# Patient Record
Sex: Male | Born: 1975 | Race: Black or African American | Hispanic: No | Marital: Single | Smoking: Former smoker
Health system: Southern US, Community
[De-identification: ages and names within clinical notes are randomized; demographics above are authoritative.]

## PROBLEM LIST (undated history)

## (undated) DIAGNOSIS — Z21 Asymptomatic human immunodeficiency virus [HIV] infection status: Secondary | ICD-10-CM

## (undated) DIAGNOSIS — B20 Human immunodeficiency virus [HIV] disease: Secondary | ICD-10-CM

## (undated) HISTORY — PX: COLONOSCOPY: SHX174

## (undated) HISTORY — DX: Human immunodeficiency virus (HIV) disease: B20

## (undated) HISTORY — DX: Asymptomatic human immunodeficiency virus (hiv) infection status: Z21

---

## 1999-04-11 ENCOUNTER — Emergency Department (HOSPITAL_COMMUNITY): Admission: EM | Admit: 1999-04-11 | Discharge: 1999-04-11 | Payer: Self-pay | Admitting: Emergency Medicine

## 1999-07-02 ENCOUNTER — Emergency Department (HOSPITAL_COMMUNITY): Admission: EM | Admit: 1999-07-02 | Discharge: 1999-07-02 | Payer: Self-pay | Admitting: Emergency Medicine

## 2003-03-21 ENCOUNTER — Emergency Department (HOSPITAL_COMMUNITY): Admission: EM | Admit: 2003-03-21 | Discharge: 2003-03-21 | Payer: Self-pay | Admitting: *Deleted

## 2003-08-19 ENCOUNTER — Encounter: Payer: Self-pay | Admitting: Emergency Medicine

## 2003-08-19 ENCOUNTER — Emergency Department (HOSPITAL_COMMUNITY): Admission: AD | Admit: 2003-08-19 | Discharge: 2003-08-19 | Payer: Self-pay | Admitting: Emergency Medicine

## 2011-12-31 ENCOUNTER — Ambulatory Visit: Payer: Self-pay

## 2011-12-31 DIAGNOSIS — B20 Human immunodeficiency virus [HIV] disease: Secondary | ICD-10-CM

## 2011-12-31 DIAGNOSIS — B009 Herpesviral infection, unspecified: Secondary | ICD-10-CM

## 2011-12-31 LAB — CBC WITH DIFFERENTIAL/PLATELET
Eosinophils Absolute: 0.1 10*3/uL (ref 0.0–0.7)
Eosinophils Relative: 2 % (ref 0–5)
HCT: 38.6 % — ABNORMAL LOW (ref 39.0–52.0)
Hemoglobin: 13.3 g/dL (ref 13.0–17.0)
Lymphs Abs: 1.9 10*3/uL (ref 0.7–4.0)
MCH: 28.1 pg (ref 26.0–34.0)
MCHC: 34.5 g/dL (ref 30.0–36.0)
MCV: 81.4 fL (ref 78.0–100.0)
WBC: 6.1 10*3/uL (ref 4.0–10.5)

## 2011-12-31 LAB — LIPID PANEL
Cholesterol: 129 mg/dL (ref 0–200)
HDL: 29 mg/dL — ABNORMAL LOW (ref >39)
LDL Cholesterol: 63 mg/dL (ref 0–99)
Triglycerides: 187 mg/dL — ABNORMAL HIGH (ref <150)

## 2011-12-31 LAB — RPR

## 2012-01-01 LAB — COMPLETE METABOLIC PANEL WITH GFR
ALT: 21 U/L (ref 0–53)
AST: 34 U/L (ref 0–37)
CO2: 25 mEq/L (ref 19–32)
Chloride: 104 mEq/L (ref 96–112)
Glucose, Bld: 57 mg/dL — ABNORMAL LOW (ref 70–99)
Potassium: 4.2 mEq/L (ref 3.5–5.3)
Sodium: 138 mEq/L (ref 135–145)
Total Protein: 7.2 g/dL (ref 6.0–8.3)

## 2012-01-01 LAB — GC/CHLAMYDIA PROBE AMP, URINE: Chlamydia, Swab/Urine, PCR: NEGATIVE

## 2012-01-01 LAB — URINALYSIS
Glucose, UA: NEGATIVE mg/dL
Leukocytes, UA: NEGATIVE
Nitrite: NEGATIVE
Protein, ur: NEGATIVE mg/dL
Urobilinogen, UA: 0.2 mg/dL (ref 0.0–1.0)

## 2012-01-01 LAB — HEPATITIS A ANTIBODY, TOTAL: Hep A Total Ab: NEGATIVE

## 2012-01-01 LAB — T-HELPER CELL (CD4) - (RCID CLINIC ONLY): CD4 % Helper T Cell: 24 % — ABNORMAL LOW (ref 33–55)

## 2012-01-01 LAB — HEPATITIS B CORE ANTIBODY, TOTAL: Hep B Core Total Ab: NEGATIVE

## 2012-01-01 LAB — HEPATITIS C ANTIBODY: HCV Ab: NEGATIVE

## 2012-01-01 LAB — HEPATITIS B SURFACE ANTIGEN: Hepatitis B Surface Ag: NEGATIVE

## 2012-01-04 LAB — HIV-1 RNA ULTRAQUANT REFLEX TO GENTYP+: HIV-1 RNA Quant, Log: <1.3 {Log} (ref <1.30)

## 2012-01-12 DIAGNOSIS — B009 Herpesviral infection, unspecified: Secondary | ICD-10-CM | POA: Insufficient documentation

## 2012-01-12 NOTE — Progress Notes (Signed)
Pt was incarcerated for 12 years.  Released November 2012.  Pt states he is adherent with ART.

## 2012-01-14 ENCOUNTER — Ambulatory Visit (INDEPENDENT_AMBULATORY_CARE_PROVIDER_SITE_OTHER): Payer: Self-pay | Admitting: Internal Medicine

## 2012-01-14 ENCOUNTER — Encounter: Payer: Self-pay | Admitting: Internal Medicine

## 2012-01-14 VITALS — BP 152/74 | HR 67 | Temp 97.5°F | Ht 72.5 in | Wt 212.8 lb

## 2012-01-14 DIAGNOSIS — B20 Human immunodeficiency virus [HIV] disease: Secondary | ICD-10-CM | POA: Insufficient documentation

## 2012-01-14 DIAGNOSIS — Z21 Asymptomatic human immunodeficiency virus [HIV] infection status: Secondary | ICD-10-CM

## 2012-01-14 DIAGNOSIS — B009 Herpesviral infection, unspecified: Secondary | ICD-10-CM

## 2012-01-14 DIAGNOSIS — Z23 Encounter for immunization: Secondary | ICD-10-CM

## 2012-01-14 MED ORDER — EMTRICITABINE-TENOFOVIR DF 200-300 MG PO TABS: 1.0000 | ORAL_TABLET | Freq: Every day | ORAL | Status: DC

## 2012-01-14 MED ORDER — ATAZANAVIR SULFATE 300 MG PO CAPS: 300.0000 mg | ORAL_CAPSULE | Freq: Every day | ORAL | Status: DC

## 2012-01-14 MED ORDER — RITONAVIR 100 MG PO CAPS: 100.0000 mg | ORAL_CAPSULE | Freq: Every day | ORAL | Status: DC

## 2012-01-14 NOTE — Patient Instructions (Signed)
Start your medicines once you receive it and come in for labs about 3 weeks later.

## 2012-01-14 NOTE — Assessment & Plan Note (Signed)
He has been well controlled on his therapy, unfortunately, there will be a delay in getting him back on therrapy through [facility].  He will restart the same and have follow up labs about 2-3 weeks later and follow up appt 2 weeks after that.  I reminded him to use condoms with all sexual activity.

## 2012-01-14 NOTE — Progress Notes (Signed)
  Subjective:    Patient ID: Jose Carter, male    DOB: 29-Jan-1976, 36 y.o.   MRN: [id number]  HPI He comes in as a new patient.  He was recently released from jail and has been HIV positive since 2001.  He has been on Atripla in the past briefly and Kaletra and Truvada until 2007, when he took a drug holiday.  He then started on boosted Reyataz and Truvada which he has been on since.  He reports excellent adherence and denies any missed doses.  He is currently living in a halfway house and exercises regularly and eats healthy.  He has had no recent weight loss, diarrhea, rashes or dysphagia.    Review of Systems  Constitutional: Negative for fever, fatigue and unexpected weight change.  HENT: Negative for sore throat and trouble swallowing.   Respiratory: Negative for cough and shortness of breath.   Cardiovascular: Negative for chest pain and leg swelling.  Gastrointestinal: Negative for nausea, vomiting, abdominal pain, diarrhea and constipation.  Genitourinary: Negative for discharge and genital sores.  Musculoskeletal: Negative for myalgias and arthralgias.  Skin: Negative for pallor and rash.  Neurological: Negative for light-headedness and headaches.  Hematological: Negative for adenopathy.  Psychiatric/Behavioral: Negative for dysphoric mood. The patient is not nervous/anxious.        Objective:   Physical Exam  Constitutional: He is oriented to person, place, and time. He appears well-developed and well-nourished. No distress.  HENT:  Mouth/Throat: Oropharynx is clear and moist. No oropharyngeal exudate.  Cardiovascular: Normal rate, regular rhythm and normal heart sounds.  Exam reveals no gallop and no friction rub.   No murmur heard. Pulmonary/Chest: Effort normal and breath sounds normal. No respiratory distress. He has no wheezes. He has no rales.  Abdominal: Soft. Bowel sounds are normal. He exhibits no distension. There is no tenderness. There is no rebound.    Genitourinary: Penis normal. No penile tenderness.  Musculoskeletal: Normal range of motion. He exhibits no edema.  Lymphadenopathy:    He has no cervical adenopathy.  Neurological: He is alert and oriented to person, place, and time.  Skin: Skin is warm and dry. No rash noted. No erythema.  Psychiatric: He has a normal mood and affect. His behavior is normal.          Assessment & Plan:

## 2012-01-22 ENCOUNTER — Encounter: Payer: Self-pay | Admitting: Internal Medicine

## 2012-01-27 ENCOUNTER — Other Ambulatory Visit: Payer: Self-pay | Admitting: *Deleted

## 2012-01-27 DIAGNOSIS — B20 Human immunodeficiency virus [HIV] disease: Secondary | ICD-10-CM

## 2012-01-27 MED ORDER — RITONAVIR 100 MG PO CAPS: 100.0000 mg | ORAL_CAPSULE | Freq: Every day | ORAL | Status: DC

## 2012-01-27 MED ORDER — EMTRICITABINE-TENOFOVIR DF 200-300 MG PO TABS: 1.0000 | ORAL_TABLET | Freq: Every day | ORAL | Status: DC

## 2012-01-27 MED ORDER — ATAZANAVIR SULFATE 300 MG PO CAPS: 300.0000 mg | ORAL_CAPSULE | Freq: Every day | ORAL | Status: DC

## 2012-02-18 ENCOUNTER — Other Ambulatory Visit: Payer: Self-pay

## 2012-02-25 ENCOUNTER — Ambulatory Visit (INDEPENDENT_AMBULATORY_CARE_PROVIDER_SITE_OTHER): Payer: Self-pay | Admitting: Licensed Clinical Social Worker

## 2012-02-25 ENCOUNTER — Other Ambulatory Visit (INDEPENDENT_AMBULATORY_CARE_PROVIDER_SITE_OTHER): Payer: Self-pay

## 2012-02-25 DIAGNOSIS — B20 Human immunodeficiency virus [HIV] disease: Secondary | ICD-10-CM

## 2012-02-25 DIAGNOSIS — Z23 Encounter for immunization: Secondary | ICD-10-CM

## 2012-02-25 LAB — COMPREHENSIVE METABOLIC PANEL
Alkaline Phosphatase: 61 U/L (ref 39–117)
BUN: 20 mg/dL (ref 6–23)
CO2: 25 mEq/L (ref 19–32)
Creat: 1.39 mg/dL — ABNORMAL HIGH (ref 0.50–1.35)
Glucose, Bld: 76 mg/dL (ref 70–99)
Total Bilirubin: 2.5 mg/dL — ABNORMAL HIGH (ref 0.3–1.2)

## 2012-02-25 LAB — CBC WITH DIFFERENTIAL/PLATELET
Basophils Relative: 0 % (ref 0–1)
Eosinophils Absolute: 0.2 10*3/uL (ref 0.0–0.7)
Eosinophils Relative: 3 % (ref 0–5)
Hemoglobin: 13.6 g/dL (ref 13.0–17.0)
Lymphs Abs: 1.9 10*3/uL (ref 0.7–4.0)
MCH: 27.4 pg (ref 26.0–34.0)
MCHC: 34.4 g/dL (ref 30.0–36.0)
MCV: 79.6 fL (ref 78.0–100.0)
Monocytes Relative: 8 % (ref 3–12)
RBC: 4.96 MIL/uL (ref 4.22–5.81)

## 2012-02-26 LAB — T-HELPER CELL (CD4) - (RCID CLINIC ONLY)
CD4 % Helper T Cell: 18 % — ABNORMAL LOW (ref 33–55)
CD4 T Cell Abs: 370 uL — ABNORMAL LOW (ref 400–2700)

## 2012-02-29 LAB — HIV-1 RNA ULTRAQUANT REFLEX TO GENTYP+: HIV 1 RNA Quant: 384 copies/mL — ABNORMAL HIGH (ref <20)

## 2012-03-03 ENCOUNTER — Ambulatory Visit: Payer: Self-pay | Admitting: Internal Medicine

## 2012-03-10 ENCOUNTER — Encounter: Payer: Self-pay | Admitting: Internal Medicine

## 2012-03-10 ENCOUNTER — Ambulatory Visit (INDEPENDENT_AMBULATORY_CARE_PROVIDER_SITE_OTHER): Payer: Self-pay | Admitting: Internal Medicine

## 2012-03-10 VITALS — BP 119/74 | HR 56 | Temp 97.3°F | Ht 73.0 in | Wt 209.0 lb

## 2012-03-10 DIAGNOSIS — B20 Human immunodeficiency virus [HIV] disease: Secondary | ICD-10-CM

## 2012-03-13 ENCOUNTER — Encounter: Payer: Self-pay | Admitting: Internal Medicine

## 2012-03-13 NOTE — Assessment & Plan Note (Signed)
He is back on meds and his viral load is just in the hundreds.  I suspect it is coming back down well again.  I will have him return in 2 months for labs to assure it is responding.  He was reminded to use condoms for all sexual activity.

## 2012-03-13 NOTE — Progress Notes (Signed)
  Subjective:    Patient ID: Jose Carter, male    DOB: [birth year], 36 y.o.   MRN: [id number]  HPI  Second visit for this patient.  He was recently released from jail and has been HIV positive since 2001. He has been on Atripla in the past briefly and Kaletra and Truvada until 2007, when he took a drug holiday. He then started on boosted Reyataz and Truvada about 1-2 years ago, which he has been on since. He reports excellent adherence and denies any missed doses. He is currently living in a halfway house and exercises regularly and eats healthy. Unfortunately though, he ran out of his meds and restarted not long prior to his lab visit.  He was out about 3 weeks while he got established with [facility].  Now on it again, he denies any missed doses.       Review of Systems  Constitutional: Negative for fever, chills, appetite change and unexpected weight change.  HENT: Negative for sore throat and trouble swallowing.   Respiratory: Negative for cough and shortness of breath.   Cardiovascular: Negative for chest pain, palpitations and leg swelling.  Gastrointestinal: Negative for abdominal pain, diarrhea and constipation.  Genitourinary: Negative for discharge and genital sores.  Musculoskeletal: Negative for myalgias and arthralgias.  Skin: Negative for pallor and rash.  Neurological: Negative for dizziness and headaches.  Hematological: Negative for adenopathy.  Psychiatric/Behavioral: Negative for dysphoric mood. The patient is not nervous/anxious.        Objective:   Physical Exam  Constitutional: He appears well-developed and well-nourished. No distress.  HENT:  Mouth/Throat: Oropharynx is clear and moist. No oropharyngeal exudate.  Eyes: No scleral icterus.  Cardiovascular: Normal rate, regular rhythm and normal heart sounds.  Exam reveals no gallop and no friction rub.   No murmur heard. Pulmonary/Chest: Effort normal and breath sounds normal. No respiratory distress. He has no  wheezes. He has no rales.  Abdominal: Soft. Bowel sounds are normal. He exhibits no distension. There is no tenderness. There is no rebound.  Lymphadenopathy:    He has no cervical adenopathy.  Skin: Skin is warm and dry. No rash noted.          Assessment & Plan:

## 2012-05-01 ENCOUNTER — Emergency Department (HOSPITAL_COMMUNITY)
Admission: EM | Admit: 2012-05-01 | Discharge: 2012-05-01 | Disposition: A | Discharge status: Home / Self Care | Payer: Medicaid Other | Attending: Emergency Medicine | Admitting: Emergency Medicine

## 2012-05-01 ENCOUNTER — Encounter (HOSPITAL_COMMUNITY): Payer: Self-pay | Admitting: *Deleted

## 2012-05-01 DIAGNOSIS — R197 Diarrhea, unspecified: Secondary | ICD-10-CM | POA: Insufficient documentation

## 2012-05-01 DIAGNOSIS — K5289 Other specified noninfective gastroenteritis and colitis: Secondary | ICD-10-CM | POA: Insufficient documentation

## 2012-05-01 DIAGNOSIS — R112 Nausea with vomiting, unspecified: Secondary | ICD-10-CM | POA: Insufficient documentation

## 2012-05-01 DIAGNOSIS — R1310 Dysphagia, unspecified: Secondary | ICD-10-CM | POA: Insufficient documentation

## 2012-05-01 DIAGNOSIS — Z21 Asymptomatic human immunodeficiency virus [HIV] infection status: Secondary | ICD-10-CM | POA: Insufficient documentation

## 2012-05-01 DIAGNOSIS — M549 Dorsalgia, unspecified: Secondary | ICD-10-CM | POA: Insufficient documentation

## 2012-05-01 DIAGNOSIS — K529 Noninfective gastroenteritis and colitis, unspecified: Secondary | ICD-10-CM

## 2012-05-01 DIAGNOSIS — R1013 Epigastric pain: Secondary | ICD-10-CM | POA: Insufficient documentation

## 2012-05-01 DIAGNOSIS — Z79899 Other long term (current) drug therapy: Secondary | ICD-10-CM | POA: Insufficient documentation

## 2012-05-01 LAB — COMPREHENSIVE METABOLIC PANEL
ALT: 22 U/L (ref 0–53)
Alkaline Phosphatase: 63 U/L (ref 39–117)
CO2: 29 mEq/L (ref 19–32)
GFR calc Af Amer: 69 mL/min — ABNORMAL LOW (ref >90)
GFR calc non Af Amer: 59 mL/min — ABNORMAL LOW (ref >90)
Glucose, Bld: 116 mg/dL — ABNORMAL HIGH (ref 70–99)
Potassium: 4.2 mEq/L (ref 3.5–5.1)
Sodium: 139 mEq/L (ref 135–145)
Total Bilirubin: 0.6 mg/dL (ref 0.3–1.2)

## 2012-05-01 LAB — URINALYSIS, ROUTINE W REFLEX MICROSCOPIC
Glucose, UA: NEGATIVE mg/dL
Ketones, ur: NEGATIVE mg/dL
Leukocytes, UA: NEGATIVE
Protein, ur: NEGATIVE mg/dL

## 2012-05-01 LAB — DIFFERENTIAL
Basophils Relative: 0 % (ref 0–1)
Eosinophils Absolute: 0.1 10*3/uL (ref 0.0–0.7)
Monocytes Relative: 6 % (ref 3–12)
Neutrophils Relative %: 87 % — ABNORMAL HIGH (ref 43–77)

## 2012-05-01 LAB — CBC
MCH: 28.3 pg (ref 26.0–34.0)
MCHC: 35.6 g/dL (ref 30.0–36.0)
Platelets: 201 10*3/uL (ref 150–400)

## 2012-05-01 LAB — ETHANOL: Alcohol, Ethyl (B): <11 mg/dL (ref 0–11)

## 2012-05-01 MED ORDER — ONDANSETRON HCL 4 MG PO TABS: 4.0000 mg | ORAL_TABLET | Freq: Three times a day (TID) | ORAL | Status: DC | PRN

## 2012-05-01 MED ORDER — ONDANSETRON HCL 4 MG/2ML IJ SOLN
4.0000 mg | Freq: Once | INTRAMUSCULAR | Status: AC
  Administered 2012-05-01: 4 mg via INTRAVENOUS
  Filled 2012-05-01: qty 2

## 2012-05-01 MED ORDER — HYDROCODONE-ACETAMINOPHEN 5-325 MG PO TABS: 1.0000 | ORAL_TABLET | Freq: Four times a day (QID) | ORAL | Status: AC | PRN

## 2012-05-01 MED ORDER — SODIUM CHLORIDE 0.9 % IV BOLUS (SEPSIS)
1000.0000 mL | Freq: Once | INTRAVENOUS | Status: AC
  Administered 2012-05-01: 1000 mL via INTRAVENOUS

## 2012-05-01 MED ORDER — ONDANSETRON HCL 4 MG PO TABS: 4.0000 mg | ORAL_TABLET | Freq: Three times a day (TID) | ORAL | Status: AC | PRN

## 2012-05-01 MED ORDER — SODIUM CHLORIDE 0.9 % IV BOLUS (SEPSIS): 500.0000 mL | Freq: Once | INTRAVENOUS | Status: DC

## 2012-05-01 MED ORDER — SODIUM CHLORIDE 0.9 % IV BOLUS (SEPSIS)
500.0000 mL | Freq: Once | INTRAVENOUS | Status: AC
  Administered 2012-05-01: 08:00:00 via INTRAVENOUS

## 2012-05-01 MED ORDER — HYDROMORPHONE HCL PF 1 MG/ML IJ SOLN
1.0000 mg | INTRAMUSCULAR | Status: DC | PRN
  Administered 2012-05-01 (×2): 1 mg via INTRAVENOUS
  Filled 2012-05-01 (×3): qty 1

## 2012-05-01 NOTE — Discharge Instructions (Signed)
Viral Gastroenteritis Viral gastroenteritis is also known as stomach flu. This condition affects the stomach and intestinal tract. It can cause sudden diarrhea and vomiting. The illness typically lasts 3 to 8 days. Most people develop an immune response that eventually gets rid of the virus. While this natural response develops, the virus can make you quite ill. CAUSES  Many different viruses can cause gastroenteritis, such as rotavirus or noroviruses. You can catch one of these viruses by consuming contaminated food or water. You may also catch a virus by sharing utensils or other personal items with an infected person or by touching a contaminated surface. SYMPTOMS  The most common symptoms are diarrhea and vomiting. These problems can cause a severe loss of body fluids (dehydration) and a body salt (electrolyte) imbalance. Other symptoms may include:  Fever.   Headache.   Fatigue.   Abdominal pain.  DIAGNOSIS  Your caregiver can usually diagnose viral gastroenteritis based on your symptoms and a physical exam. A stool sample may also be taken to test for the presence of viruses or other infections. TREATMENT  This illness typically goes away on its own. Treatments are aimed at rehydration. The most serious cases of viral gastroenteritis involve vomiting so severely that you are not able to keep fluids down. In these cases, fluids must be given through an intravenous line (IV). HOME CARE INSTRUCTIONS   Drink enough fluids to keep your urine clear or pale yellow. Drink small amounts of fluids frequently and increase the amounts as tolerated.   Ask your caregiver for specific rehydration instructions.   Avoid:   Foods high in sugar.   Alcohol.   Carbonated drinks.   Tobacco.   Juice.   Caffeine drinks.   Extremely hot or cold fluids.   Fatty, greasy foods.   Too much intake of anything at one time.   Dairy products until 24 to 48 hours after diarrhea stops.   You may  consume probiotics. Probiotics are active cultures of beneficial bacteria. They may lessen the amount and number of diarrheal stools in adults. Probiotics can be found in yogurt with active cultures and in supplements.   Wash your hands well to avoid spreading the virus.   Only take over-the-counter or prescription medicines for pain, discomfort, or fever as directed by your caregiver. Do not give aspirin to children. Antidiarrheal medicines are not recommended.   Ask your caregiver if you should continue to take your regular prescribed and over-the-counter medicines.   Keep all follow-up appointments as directed by your caregiver.  SEEK IMMEDIATE MEDICAL CARE IF:   You are unable to keep fluids down.   You do not urinate at least once every 6 to 8 hours.   You develop shortness of breath.   You notice blood in your stool or vomit. This may look like coffee grounds.   You have abdominal pain that increases or is concentrated in one small area (localized).   You have persistent vomiting or diarrhea.   You have a fever.   The patient is a child younger than 3 months, and he or she has a fever.   The patient is a child older than 3 months, and he or she has a fever and persistent symptoms.   The patient is a child older than 3 months, and he or she has a fever and symptoms suddenly get worse.   The patient is a baby, and he or she has no tears when crying.  MAKE SURE YOU:     Understand these instructions.   Will watch your condition.   Will get help right away if you are not doing well or get worse.  Document Released: 12/07/2005 Document Revised: 11/26/2011 Document Reviewed: 09/23/2011 [employer] Patient Information 2012 [employer]. 

## 2012-05-01 NOTE — ED Notes (Signed)
Urine had hair in it.  Informed pt of need for another specimen.  MD informed

## 2012-05-01 NOTE — ED Provider Notes (Signed)
History     CSN: [id number]  Arrival date & time 05/01/12  9147   First MD Initiated Contact with Patient 05/01/12 0750      Chief Complaint  Patient presents with  . Abdominal Pain    LUQ  . Nausea  . Emesis  . Diarrhea    (Consider location/radiation/quality/duration/timing/severity/associated sxs/prior treatment) HPI 36 yo male with history of HIV who presents with complaint of sudden onset abdominal pain this morning.  States that this morning he was awakened from his sleep with severe abdominal pain.  Pain is located in periumbilical and epigastric region.  He has never had pain like this before.  States pain does radiate to his back.  He has had 3 episodes of vomiting and some loose stools this morning.  Does admit to EtOH use last night, but states "it wasn't much, only a few cups."  He denies blood in his stool or emesis.  Says he has been compliant with current HIV therapy Past Medical History  Diagnosis Date  . HIV infection     History reviewed. No pertinent past surgical history.  History reviewed. No pertinent family history.  History  Substance Use Topics  . Smoking status: Current Some Day Smoker    Types: Cigarettes    Last Attempt to Quit: 02/11/2012  . Smokeless tobacco: Never Used  . Alcohol Use: Yes     socially      Review of Systems  Constitutional: Negative for fever, chills, activity change and appetite change.  HENT: Positive for trouble swallowing. Negative for sore throat.   Respiratory: Negative for cough, chest tightness and shortness of breath.   Cardiovascular: Negative for chest pain.  Gastrointestinal: Positive for nausea, vomiting, abdominal pain and diarrhea. Negative for constipation, blood in stool and abdominal distention.  Genitourinary: Negative for dysuria, urgency and frequency.  Neurological: Negative for dizziness and headaches.    Allergies  Sustiva  Home Medications   Current Outpatient Rx  Name Route Sig Dispense  Refill  . ATAZANAVIR SULFATE 300 MG PO CAPS Oral Take 1 capsule (300 mg total) by mouth daily with breakfast. 30 capsule 11  . EMTRICITABINE-TENOFOVIR 200-300 MG PO TABS Oral Take 1 tablet by mouth daily. 30 tablet 11  . RITONAVIR 100 MG PO CAPS Oral Take 1 capsule (100 mg total) by mouth daily. 30 capsule 11    BP 122/62  Pulse 75  Temp(Src) 97.2 F (36.2 C) (Oral)  Resp 20  SpO2 100%  Physical Exam  Constitutional: He is oriented to person, place, and time. He appears well-nourished.       Writhing in bed, appears to be quit uncomfortable.   HENT:  Head: Normocephalic and atraumatic.       Oropharynx pink and dry   Eyes: Conjunctivae are normal. No scleral icterus.  Neck: Normal range of motion.  Cardiovascular: Normal rate, regular rhythm and normal heart sounds.   Pulmonary/Chest: Effort normal and breath sounds normal.  Abdominal:       Abdomen is soft, however he has significant pain to palpation in the epigastric region with guarding.  No tenderness in lower abdominal regions.  BS hypoactive.   Musculoskeletal: He exhibits no edema.  Lymphadenopathy:    He has no cervical adenopathy.  Neurological: He is alert and oriented to person, place, and time.  Skin: Skin is warm and dry.    ED Course  Procedures (including critical care time)  Labs Reviewed  COMPREHENSIVE METABOLIC PANEL - Abnormal; Notable for the  following:    Glucose, Bld 116 (*)    Creatinine, Ser 1.50 (*)    GFR calc non Af Amer 59 (*)    GFR calc Af Amer 69 (*)    All other components within normal limits  CBC - Abnormal; Notable for the following:    WBC 10.6 (*)    All other components within normal limits  DIFFERENTIAL - Abnormal; Notable for the following:    Neutrophils Relative 87 (*)    Neutro Abs 9.3 (*)    Lymphocytes Relative 7 (*)    All other components within normal limits  LIPASE, BLOOD  ETHANOL  URINALYSIS, ROUTINE W REFLEX MICROSCOPIC   No results found.   No diagnosis  found.    MDM  Seems to be in a good deal of pain.  Concern for possible pancreatitis given amount of pain, n/v and location of pain in setting of recent EtOH use.  Check labs, dilaudid for pain control, zofran for nausea. 8:21 AM  On reassessment patient states pain has resolved with 1mg  dilaudid.  WBC elevated, with left shift.  Could be from demargination from vomiting.  Will await UA.  Lipase normal, looking to be likely gastroenteritis. 9:43 AM  Patient feeling much better and comfortable with going home, likely gastroenteritis, will give zofran for nausea control and norco for pain control.  Advised to f/u with ID clinic or return to ED for worsening symptoms.          Everrett Coombe, DO 05/01/12 1137

## 2012-05-01 NOTE — ED Notes (Signed)
Pt from home with reports of sudden onset of abdominal pain at around 0500 this morning as well as N/V/D.

## 2012-05-09 ENCOUNTER — Other Ambulatory Visit (INDEPENDENT_AMBULATORY_CARE_PROVIDER_SITE_OTHER): Payer: Self-pay

## 2012-05-09 DIAGNOSIS — B20 Human immunodeficiency virus [HIV] disease: Secondary | ICD-10-CM

## 2012-05-09 LAB — CBC WITH DIFFERENTIAL/PLATELET
Eosinophils Absolute: 0.2 10*3/uL (ref 0.0–0.7)
Eosinophils Relative: 4 % (ref 0–5)
HCT: 36.2 % — ABNORMAL LOW (ref 39.0–52.0)
Hemoglobin: 12.7 g/dL — ABNORMAL LOW (ref 13.0–17.0)
Lymphs Abs: 2.1 10*3/uL (ref 0.7–4.0)
MCH: 27.9 pg (ref 26.0–34.0)
MCV: 79.6 fL (ref 78.0–100.0)
Monocytes Absolute: 0.4 10*3/uL (ref 0.1–1.0)
Monocytes Relative: 7 % (ref 3–12)
Platelets: 232 10*3/uL (ref 150–400)
RBC: 4.55 MIL/uL (ref 4.22–5.81)

## 2012-05-09 LAB — COMPLETE METABOLIC PANEL WITHOUT GFR
ALT: 25 U/L (ref 0–53)
AST: 39 U/L — ABNORMAL HIGH (ref 0–37)
Albumin: 4.2 g/dL (ref 3.5–5.2)
Alkaline Phosphatase: 56 U/L (ref 39–117)
BUN: 21 mg/dL (ref 6–23)
CO2: 26 meq/L (ref 19–32)
Calcium: 9.1 mg/dL (ref 8.4–10.5)
Chloride: 106 meq/L (ref 96–112)
Creat: 1.42 mg/dL — ABNORMAL HIGH (ref 0.50–1.35)
GFR, Est African American: 74 mL/min
GFR, Est Non African American: 64 mL/min
Glucose, Bld: 80 mg/dL (ref 70–99)
Potassium: 4.2 meq/L (ref 3.5–5.3)
Sodium: 140 meq/L (ref 135–145)
Total Bilirubin: 2.8 mg/dL — ABNORMAL HIGH (ref 0.3–1.2)
Total Protein: 6.5 g/dL (ref 6.0–8.3)

## 2012-05-10 NOTE — ED Provider Notes (Signed)
I saw and evaluated the patient, reviewed the resident's note and I agree with the findings and plan.   .Face to face Exam:  General:  Awake HEENT:  Atraumatic Resp:  Normal effort Abd:  Nondistended Neuro:No focal weakness Lymph: No adenopathy   Nelia Shi, MD 05/10/12 [phone]

## 2012-05-11 LAB — T-HELPER CELL (CD4) - (RCID CLINIC ONLY): CD4 % Helper T Cell: 19 % — ABNORMAL LOW (ref 33–55)

## 2012-05-24 ENCOUNTER — Encounter: Payer: Self-pay | Admitting: Internal Medicine

## 2012-05-24 ENCOUNTER — Ambulatory Visit (INDEPENDENT_AMBULATORY_CARE_PROVIDER_SITE_OTHER): Payer: Self-pay | Admitting: Internal Medicine

## 2012-05-24 VITALS — BP 143/82 | HR 61 | Temp 97.6°F | Ht 73.0 in | Wt 209.0 lb

## 2012-05-24 DIAGNOSIS — B20 Human immunodeficiency virus [HIV] disease: Secondary | ICD-10-CM

## 2012-05-24 NOTE — Progress Notes (Signed)
  Subjective:    Patient ID: Jose Carter, male    DOB: Dec 31, 1975, 36 y.o.   MRN: [id number]  HPI He comes in for follow up of his HIV. He had previously been on Kaletra and Truvada as well as Atripla was started posted Reyataz and Truvada in jail and has been on it since. He reports good compliance since then and did become undetectable though has had some detectable virus since. His most recent labs do show minimally detectable virus at 37 copies and a T-cell count of 370 which is stable from his last visit. He does also have a minimally elevated creatinine at 1.4 to those creatinine clearance is well over 50. He tells me that he is taking a lot of different supplements that he gets from [facility] including testosterone as well as stacking supplements. He takes this in order to increase his bulk. He also complains of intermittent chest pain that is palpable.   Review of Systems  Constitutional: Negative for fever, chills, fatigue and unexpected weight change.  HENT: Negative for sore throat and trouble swallowing.   Respiratory: Negative for cough, shortness of breath and wheezing.   Cardiovascular: Negative for chest pain, palpitations and leg swelling.       Chest wall pain  Gastrointestinal: Negative for nausea, abdominal pain and diarrhea.  Musculoskeletal: Negative for myalgias, joint swelling and arthralgias.  Skin: Negative for pallor and rash.  Neurological: Negative for dizziness and headaches.  Hematological: Negative for adenopathy.  Psychiatric/Behavioral: Negative for dysphoric mood. The patient is not nervous/anxious.        Objective:   Physical Exam  Constitutional: He appears well-developed and well-nourished. No distress.  HENT:  Mouth/Throat: Oropharynx is clear and moist. No oropharyngeal exudate.  Cardiovascular: Normal rate, regular rhythm and normal heart sounds.  Exam reveals no gallop and no friction rub.   No murmur heard. Pulmonary/Chest: Effort normal and  breath sounds normal. No respiratory distress. He has no wheezes. He has no rales.  Abdominal: Soft. Bowel sounds are normal. He exhibits no distension. There is no tenderness. There is no rebound.  Lymphadenopathy:    He has no cervical adenopathy.  Skin: Skin is warm and dry. No rash noted. No erythema.          Assessment & Plan:

## 2012-05-24 NOTE — Assessment & Plan Note (Signed)
He is doing well with his regimen and does take it every day. He does still have some detectable virus his CD4 has remained same as before. I did discuss with him at length that some of these supplements can interact with his medications it is unknown what they're effective since they were not studied. I also worry about his creatinine increasing from the supplements and then somebody taking tenofovir this can exacerbate the effect. I have asked him and he has agreed to stop taking the supplements for the next few months to see if he does have some improvement in his creatinine. I also did remind him to use condoms with all sexual activity

## 2012-08-11 ENCOUNTER — Other Ambulatory Visit (INDEPENDENT_AMBULATORY_CARE_PROVIDER_SITE_OTHER): Payer: Self-pay

## 2012-08-11 ENCOUNTER — Ambulatory Visit (INDEPENDENT_AMBULATORY_CARE_PROVIDER_SITE_OTHER): Payer: Self-pay | Admitting: Internal Medicine

## 2012-08-11 ENCOUNTER — Encounter: Payer: Self-pay | Admitting: Internal Medicine

## 2012-08-11 ENCOUNTER — Encounter: Payer: Self-pay | Admitting: *Deleted

## 2012-08-11 VITALS — BP 120/76 | HR 45 | Temp 97.5°F | Wt 217.0 lb

## 2012-08-11 DIAGNOSIS — B20 Human immunodeficiency virus [HIV] disease: Secondary | ICD-10-CM

## 2012-08-11 DIAGNOSIS — G510 Bell's palsy: Secondary | ICD-10-CM | POA: Insufficient documentation

## 2012-08-11 LAB — COMPLETE METABOLIC PANEL WITH GFR
Alkaline Phosphatase: 68 U/L (ref 39–117)
Creat: 1.35 mg/dL (ref 0.50–1.35)
GFR, Est African American: 79 mL/min
GFR, Est Non African American: 68 mL/min
Glucose, Bld: 82 mg/dL (ref 70–99)
Sodium: 139 mEq/L (ref 135–145)
Total Bilirubin: 2.5 mg/dL — ABNORMAL HIGH (ref 0.3–1.2)
Total Protein: 6.9 g/dL (ref 6.0–8.3)

## 2012-08-11 LAB — CBC WITH DIFFERENTIAL/PLATELET
Basophils Relative: 0 % (ref 0–1)
Eosinophils Absolute: 0.2 10*3/uL (ref 0.0–0.7)
Eosinophils Relative: 3 % (ref 0–5)
MCH: 28 pg (ref 26.0–34.0)
MCHC: 33.2 g/dL (ref 30.0–36.0)
MCV: 84.6 fL (ref 78.0–100.0)
Monocytes Relative: 9 % (ref 3–12)
Neutrophils Relative %: 54 % (ref 43–77)
Platelets: 306 10*3/uL (ref 150–400)

## 2012-08-11 MED ORDER — PREDNISONE 20 MG PO TABS: ORAL_TABLET | ORAL | Status: DC

## 2012-08-11 NOTE — Progress Notes (Signed)
Patient ID: Jose Carter, male   DOB: 24-Oct-1976, 36 y.o.   MRN: [id number] RN spoke with pt who reports left-sided facial/oral numbness and decreased muscle movement on left side of face.  RN spoke with Dr. Drue Second who said she would see the pt.  Appt made for this AM.

## 2012-08-11 NOTE — Progress Notes (Signed)
HIV CLINIC VISIT  RFV: sick appt/ new onset facial numbness/ asymmetric smile Subjective:    Patient ID: Jose Carter, male    DOB: 10-29-1976, 36 y.o.   MRN: [id number]  HPI Kiril is a 35yo AAM with HIV, CD 4 count of X, VL Y, on truvada-boosted atazanavir. Has spotty adherence. He states that he has missed multiple doses in the last few weeks. Two wks ago, left for vacation to [place], missed his meds for 3 days straight, then a week later, missed 2 days.   He stated that this morning, he woke up with Left side neck pain which he attributes to working out. He would bench press bar bells but was looking to the right. He did notice this morning that he was having difficulty pursing his lips together. Other unusual symptoms include yesterday afternoon felt tongue being numb but thought to be due to pineapples. He notices the Left sided  Of face being numb - mostly on cheeks. No new rash, no vesicles, no ringing in his ears, no decrease hearing.  No fevers, chills. No diarrhea  Current Outpatient Prescriptions on File Prior to Visit  Medication Sig Dispense Refill  . atazanavir (REYATAZ) 300 MG capsule Take 1 capsule (300 mg total) by mouth daily with breakfast.  30 capsule  11  . emtricitabine-tenofovir (TRUVADA) 200-300 MG per tablet Take 1 tablet by mouth daily.  30 tablet  11  . ritonavir (NORVIR) 100 MG capsule Take 1 capsule (100 mg total) by mouth daily.  30 capsule  11   Active Ambulatory Problems    Diagnosis Date Noted  . 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 HIV disease 01/14/2012   Resolved Ambulatory Problems    Diagnosis Date Noted  . No Resolved Ambulatory Problems   Past Medical History  Diagnosis Date  . HIV infection     s Review of Systems 10 point ROS reviewed, positive pertinents as listed above    Objective:   Physical Exam BP 120/76  Pulse 45  Temp 97.5 F (36.4 C) (Oral)  Wt 217 lb (98.431 kg) Gen: young male in NAD, no slurred speech.  neuro: sensation decreased  only to V2 distribution/only cheek. Motor: Noticeable unable to close left eyelid tightly. Asymmetry on smile. Unable to pucker lips. He is able to lift eyebrows equally. uvulla hangs left. HEENT= no rash, asymmetry as mentioned above. Good dentition. TM ? Unroofed vesicle at the floor vs. Cerumen. Old healed scar/LN right occiput. Lymph = mobile, nontender subcm, submandibular LN  On the left jaw. Pulm= CTAB, no w/c/r Cors= nl s1,s2 Abd= NTND Ext= no edema.  Skin =no rash       Assessment & Plan:  Left sided bell's palsy = will prednisone 60mg  x 7 days, then taper to 40mg  x 2 days, 20mg  x 2 days,then 10mg  x 2 days. Can use artificial tears OTC q1-2hr during waking hours to OS  HIV = will check labs. Concern that he will have worsening viremia/possible resistance emerging. May need genotype +/- HLA [id number] testing  Adherence = set your cell phone as an alarm as a reminder.  Supplements= need to stop supplements (testoterone), although he  did stop powder.  rtc 10 -15 days with comer   25 minutes spent counseling patient and discussing management of hiv and bell's palsy

## 2012-08-12 LAB — T-HELPER CELL (CD4) - (RCID CLINIC ONLY): CD4 % Helper T Cell: 22 % — ABNORMAL LOW (ref 33–55)

## 2012-08-12 LAB — HIV-1 RNA ULTRAQUANT REFLEX TO GENTYP+: HIV 1 RNA Quant: <20 copies/mL (ref <20)

## 2012-08-25 ENCOUNTER — Encounter: Payer: Self-pay | Admitting: Internal Medicine

## 2012-08-25 ENCOUNTER — Ambulatory Visit (INDEPENDENT_AMBULATORY_CARE_PROVIDER_SITE_OTHER): Payer: Self-pay | Admitting: Internal Medicine

## 2012-08-25 VITALS — BP 140/66 | HR 69 | Temp 97.6°F | Ht 73.0 in | Wt 216.0 lb

## 2012-08-25 DIAGNOSIS — Z23 Encounter for immunization: Secondary | ICD-10-CM

## 2012-08-25 DIAGNOSIS — B20 Human immunodeficiency virus [HIV] disease: Secondary | ICD-10-CM

## 2012-08-25 DIAGNOSIS — N289 Disorder of kidney and ureter, unspecified: Secondary | ICD-10-CM

## 2012-08-25 DIAGNOSIS — G510 Bell's palsy: Secondary | ICD-10-CM

## 2012-08-25 NOTE — Assessment & Plan Note (Signed)
This has nearly resolved and no new issues.

## 2012-08-25 NOTE — Assessment & Plan Note (Signed)
He did previously have an elevated creatinine. He was taking multiple supplements including protein and creatine.  I had him stop and has been off of this and his creatinine has now returned to normal. I told him he can now use occasional ibuprofen for his neck strain on a temporary basis and to continue off of the supplements to avoid any further renal issues.

## 2012-08-25 NOTE — Assessment & Plan Note (Signed)
He now has good compliance and his last labs did have an undetectable viral load and good CD4. I suspect he will continue to have suppression of his lungs he has good compliance. I will recheck his labs in about 3 months to assure no developing resistance.  He is concerned with his exercise regimen and would like to be checked for testosterone to see if he needs replacement since he has had low energy. He also does endorse the fact that he smokes, has recently been using drugs and other issues. I did suggest that he stop smoking as well as continue off of drugs which he says he is now off of which will greatly enhance his energy level. I will check his testosterone today and will replace only if significantly decreased.

## 2012-08-25 NOTE — Progress Notes (Signed)
  Subjective:    Patient ID: Jose Carter, male    DOB: [birth year], 36 y.o.   MRN: [id number]  HPI He comes in here for followup of his HIV. He has been on a regimen of Norvir, Reyataz and Truvada. He was last seen by my partner, Dr. Ilsa Iha, and noted to have Bell's palsy. He was given a prescription for prednisone and this has nearly resolved. It was also noted that he had been missing doses prior to that visit that he attributes to recent stressors. Since that visit though he has had excellent compliance and has had no more this doses. He otherwise feels well. He also is lifting weights and has stopped his other supplements. He did have some elevated creatinine while on the supplements. He is interested in testosterone injections.   Review of Systems  Constitutional: Negative for fever, chills, appetite change, fatigue and unexpected weight change.  HENT: Negative for sore throat and trouble swallowing.   Respiratory: Negative for cough, shortness of breath and wheezing.   Cardiovascular: Negative for chest pain, palpitations and leg swelling.  Gastrointestinal: Negative for nausea, abdominal pain and diarrhea.  Musculoskeletal: Negative for joint swelling.       Left neck strain  Skin: Negative for rash.  Neurological: Negative for dizziness, light-headedness and headaches.       Objective:   Physical Exam  Constitutional: He appears well-developed and well-nourished. No distress.  HENT:  Mouth/Throat: Oropharynx is clear and moist. No oropharyngeal exudate.  Cardiovascular: Normal rate, regular rhythm and normal heart sounds.  Exam reveals no gallop and no friction rub.   No murmur heard. Pulmonary/Chest: Effort normal and breath sounds normal. No respiratory distress. He has no wheezes. He has no rales.  Musculoskeletal:       Slight neck spasm in the SCM  Lymphadenopathy:    He has no cervical adenopathy.  Skin: No rash noted.          Assessment & Plan:

## 2012-08-26 LAB — TESTOSTERONE, FREE, TOTAL, SHBG
Sex Hormone Binding: 48 nmol/L (ref 13–71)
Testosterone: 870.25 ng/dL (ref 300–890)

## 2012-09-20 ENCOUNTER — Ambulatory Visit (INDEPENDENT_AMBULATORY_CARE_PROVIDER_SITE_OTHER): Payer: Self-pay

## 2012-09-20 DIAGNOSIS — Z23 Encounter for immunization: Secondary | ICD-10-CM

## 2012-11-10 ENCOUNTER — Other Ambulatory Visit (INDEPENDENT_AMBULATORY_CARE_PROVIDER_SITE_OTHER): Payer: Self-pay

## 2012-11-10 DIAGNOSIS — B20 Human immunodeficiency virus [HIV] disease: Secondary | ICD-10-CM

## 2012-11-10 LAB — CBC WITH DIFFERENTIAL/PLATELET
Basophils Relative: 1 % (ref 0–1)
Eosinophils Absolute: 0.2 10*3/uL (ref 0.0–0.7)
Eosinophils Relative: 3 % (ref 0–5)
HCT: 39.9 % (ref 39.0–52.0)
Hemoglobin: 13.6 g/dL (ref 13.0–17.0)
Lymphs Abs: 2.2 10*3/uL (ref 0.7–4.0)
MCH: 28 pg (ref 26.0–34.0)
MCHC: 34.1 g/dL (ref 30.0–36.0)
MCV: 82.1 fL (ref 78.0–100.0)
Monocytes Absolute: 0.4 10*3/uL (ref 0.1–1.0)
Monocytes Relative: 8 % (ref 3–12)
RBC: 4.86 MIL/uL (ref 4.22–5.81)

## 2012-11-10 LAB — COMPLETE METABOLIC PANEL WITH GFR
Alkaline Phosphatase: 64 U/L (ref 39–117)
BUN: 17 mg/dL (ref 6–23)
CO2: 25 mEq/L (ref 19–32)
GFR, Est African American: 72 mL/min
GFR, Est Non African American: 62 mL/min
Glucose, Bld: 85 mg/dL (ref 70–99)
Sodium: 136 mEq/L (ref 135–145)
Total Bilirubin: 3.1 mg/dL — ABNORMAL HIGH (ref 0.3–1.2)
Total Protein: 6.8 g/dL (ref 6.0–8.3)

## 2012-11-11 LAB — T-HELPER CELL (CD4) - (RCID CLINIC ONLY): CD4 T Cell Abs: 420 uL (ref 400–2700)

## 2012-11-12 LAB — HIV-1 RNA QUANT-NO REFLEX-BLD: HIV 1 RNA Quant: <20 copies/mL (ref <20)

## 2012-11-24 ENCOUNTER — Telehealth: Payer: Self-pay | Admitting: *Deleted

## 2012-11-24 ENCOUNTER — Ambulatory Visit: Payer: Self-pay | Admitting: Internal Medicine

## 2012-11-24 NOTE — Telephone Encounter (Signed)
Called and left patient a voice mail to call the clinic to reschedule his appt, he no showed today. Jacqueline Cockerham  

## 2013-01-24 ENCOUNTER — Ambulatory Visit (INDEPENDENT_AMBULATORY_CARE_PROVIDER_SITE_OTHER): Payer: Self-pay | Admitting: Internal Medicine

## 2013-01-24 ENCOUNTER — Encounter: Payer: Self-pay | Admitting: Internal Medicine

## 2013-01-24 VITALS — BP 127/74 | HR 69 | Temp 97.6°F | Ht 73.0 in | Wt 225.0 lb

## 2013-01-24 DIAGNOSIS — N289 Disorder of kidney and ureter, unspecified: Secondary | ICD-10-CM

## 2013-01-24 DIAGNOSIS — B20 Human immunodeficiency virus [HIV] disease: Secondary | ICD-10-CM

## 2013-01-24 LAB — COMPLETE METABOLIC PANEL WITH GFR
AST: 37 U/L (ref 0–37)
Albumin: 4.5 g/dL (ref 3.5–5.2)
Alkaline Phosphatase: 67 U/L (ref 39–117)
BUN: 27 mg/dL — ABNORMAL HIGH (ref 6–23)
Calcium: 9.9 mg/dL (ref 8.4–10.5)
Creat: 1.44 mg/dL — ABNORMAL HIGH (ref 0.50–1.35)
GFR, Est Non African American: 62 mL/min
Glucose, Bld: 78 mg/dL (ref 70–99)
Potassium: 4.3 mEq/L (ref 3.5–5.3)

## 2013-01-24 LAB — CBC WITH DIFFERENTIAL/PLATELET
Basophils Absolute: 0 10*3/uL (ref 0.0–0.1)
Lymphocytes Relative: 47 % — ABNORMAL HIGH (ref 12–46)
Lymphs Abs: 2.6 10*3/uL (ref 0.7–4.0)
MCV: 79.4 fL (ref 78.0–100.0)
Neutro Abs: 2.3 10*3/uL (ref 1.7–7.7)
Platelets: 226 10*3/uL (ref 150–400)
RBC: 5 MIL/uL (ref 4.22–5.81)
RDW: 16.4 % — ABNORMAL HIGH (ref 11.5–15.5)
WBC: 5.4 10*3/uL (ref 4.0–10.5)

## 2013-01-24 NOTE — Assessment & Plan Note (Signed)
I will check his creatinine again today. 

## 2013-01-24 NOTE — Progress Notes (Signed)
  Subjective:    Patient ID: Jose Carter, male    DOB: July 27, 1976, 37 y.o.   MRN: [id number]  HPI He comes in for follow up of his HIV. 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 He though has since quit drugs and alcohol and is getting counseling with Narcotics Anonymous where he goes every day and to church. He is thankful that there was no significant negative outcome though he understands that being off medicine can cause resistance. He is hopeful that he'll be able to continue off of drugs and alcohol.   Review of Systems  Constitutional: Negative for fever, chills, fatigue and unexpected weight change.  HENT: Negative for sore throat and trouble swallowing.   Respiratory: Negative for cough and shortness of breath.   Cardiovascular: Negative for chest pain, palpitations and leg swelling.  Gastrointestinal: Negative for nausea, abdominal pain and diarrhea.  Musculoskeletal: Negative for myalgias and arthralgias.  Neurological: Negative for dizziness and headaches.  Hematological: Negative for adenopathy.       Objective:   Physical Exam  Constitutional: He appears well-developed and well-nourished. No distress.  HENT:  Mouth/Throat: Oropharynx is clear and moist. No oropharyngeal exudate.  Cardiovascular: Normal rate, regular rhythm and normal heart sounds.  Exam reveals no gallop and no friction rub.   No murmur heard. Pulmonary/Chest: Effort normal and breath sounds normal. No respiratory distress. He has no wheezes.          Assessment & Plan:

## 2013-01-24 NOTE — Assessment & Plan Note (Signed)
I did discuss with him that I am concerned with resistance and going to check a genotype today. I will also recheck labs in about 4 weeks though if there is any resistance noted he will be called sooner. He was reminded of the mechanism of resistance.

## 2013-01-25 LAB — T-HELPER CELL (CD4) - (RCID CLINIC ONLY)
CD4 % Helper T Cell: 21 % — ABNORMAL LOW (ref 33–55)
CD4 T Cell Abs: 520 uL (ref 400–2700)

## 2013-01-26 LAB — HIV-1 RNA ULTRAQUANT REFLEX TO GENTYP+: HIV 1 RNA Quant: <20 copies/mL (ref <20)

## 2013-02-06 ENCOUNTER — Other Ambulatory Visit: Payer: Self-pay | Admitting: *Deleted

## 2013-02-06 DIAGNOSIS — B20 Human immunodeficiency virus [HIV] disease: Secondary | ICD-10-CM

## 2013-02-06 MED ORDER — EMTRICITABINE-TENOFOVIR DF 200-300 MG PO TABS: 1.0000 | ORAL_TABLET | Freq: Every day | ORAL | Status: DC

## 2013-02-06 MED ORDER — RITONAVIR 100 MG PO CAPS: 100.0000 mg | ORAL_CAPSULE | Freq: Every day | ORAL | Status: DC

## 2013-02-06 MED ORDER — ATAZANAVIR SULFATE 300 MG PO CAPS: 300.0000 mg | ORAL_CAPSULE | Freq: Every day | ORAL | Status: DC

## 2013-02-07 ENCOUNTER — Other Ambulatory Visit: Payer: Self-pay | Admitting: *Deleted

## 2013-02-07 DIAGNOSIS — B20 Human immunodeficiency virus [HIV] disease: Secondary | ICD-10-CM

## 2013-02-07 MED ORDER — RITONAVIR 100 MG PO CAPS: 100.0000 mg | ORAL_CAPSULE | Freq: Every day | ORAL | Status: DC

## 2013-02-07 MED ORDER — ATAZANAVIR SULFATE 300 MG PO CAPS: 300.0000 mg | ORAL_CAPSULE | Freq: Every day | ORAL | Status: DC

## 2013-02-07 MED ORDER — EMTRICITABINE-TENOFOVIR DF 200-300 MG PO TABS: 1.0000 | ORAL_TABLET | Freq: Every day | ORAL | Status: DC

## 2013-02-21 ENCOUNTER — Encounter: Payer: Self-pay | Admitting: Internal Medicine

## 2013-02-21 ENCOUNTER — Ambulatory Visit (INDEPENDENT_AMBULATORY_CARE_PROVIDER_SITE_OTHER): Payer: Self-pay | Admitting: Internal Medicine

## 2013-02-21 VITALS — BP 132/86 | HR 62 | Temp 97.6°F | Ht 73.0 in | Wt 227.0 lb

## 2013-02-21 DIAGNOSIS — N451 Epididymitis: Secondary | ICD-10-CM | POA: Insufficient documentation

## 2013-02-21 MED ORDER — CEFTRIAXONE SODIUM 1 G IJ SOLR
250.0000 mg | Freq: Once | INTRAMUSCULAR | Status: AC
  Administered 2013-02-21: 250 mg via INTRAMUSCULAR

## 2013-02-21 MED ORDER — CEFTRIAXONE SODIUM 250 MG IJ SOLR: 250.0000 mg | Freq: Once | INTRAMUSCULAR | Status: DC

## 2013-02-21 MED ORDER — DOXYCYCLINE HYCLATE 100 MG PO TABS: 100.0000 mg | ORAL_TABLET | Freq: Two times a day (BID) | ORAL | Status: DC

## 2013-02-21 NOTE — Progress Notes (Signed)
Patient ID: Jose Carter, male   DOB: June 20, 1976, 37 y.o.   MRN: [id number]          [facility] for Infectious Disease  Patient Active Problem List  Diagnosis  . HSV-1 infection  . HIV disease  . Bell's palsy  . Renal insufficiency  . Epididymitis, left    Patient's Medications  New Prescriptions   CEFTRIAXONE (ROCEPHIN) 250 MG INJECTION    Inject 250 mg into the muscle once.  FOR IM use in LARGE MUSCLE MASS   DOXYCYCLINE (VIBRA-TABS) 100 MG TABLET    Take 1 tablet (100 mg total) by mouth 2 (two) times daily.  Previous Medications   ATAZANAVIR (REYATAZ) 300 MG CAPSULE    Take 1 capsule (300 mg total) by mouth daily with breakfast.   EMTRICITABINE-TENOFOVIR (TRUVADA) 200-300 MG PER TABLET    Take 1 tablet by mouth daily.   RITONAVIR (NORVIR) 100 MG CAPSULE    Take 1 capsule (100 mg total) by mouth daily.  Modified Medications   No medications on file  Discontinued Medications   No medications on file    Subjective: Jose Carter is seen on a work in basis. 6 days ago he developed severe left testicular pain. He has had a little bit of intermittent dysuria and no discharge. He is a 2 male partners in the past month. His use condoms for intercourse but not always 4 oral sex. His abdominal fever, chills or sweats. He says he was out of his HIV medicines for 5 straight days when his prescription expired. They have been refilled and he has not missed any since that time.  Objective: Temp: 97.6 F (36.4 C) (03/04 1435) Temp src: Oral (03/04 1435) BP: 132/86 mmHg (03/04 1435) Pulse Rate: 62 (03/04 1435)  General: he is uncomfortable due to pain Skin: no rash Lungs: clear Cor: regular S1 and S2 no murmurs Abdomen: nontender Left testicle and epididymis swollen and exquisitely tender  Lab Results HIV 1 RNA Quant (copies/mL)  Date Value  01/24/2013 <20   11/10/2012 <20   08/11/2012 <20      CD4 T Cell Abs (cmm)  Date Value  01/24/2013 520   11/10/2012 420     08/11/2012 370*     Assessment: Left epididymitis.  His HIV infection is under excellent control.  Plan: 1. Check UA, urine culture, urine for GC and Chlamydia 2. Ceftriaxone 250 mg IM and doxycycline 100 mg twice daily for 7 days 3. Continue Truvada, Reyataz and Norvir   Jose Asters, MD [facility] for Infectious Disease [facility] [phone] pager   [phone] cell 02/21/2013, 2:59 PM

## 2013-02-21 NOTE — Addendum Note (Signed)
Addended by: Mariea Clonts D on: 02/21/2013 04:38 PM   Modules accepted: Orders

## 2013-02-21 NOTE — Addendum Note (Signed)
Addended by: Andree Coss on: 02/21/2013 03:34 PM   Modules accepted: Orders, Medications

## 2013-02-22 ENCOUNTER — Telehealth: Payer: Self-pay | Admitting: *Deleted

## 2013-02-22 LAB — URINALYSIS, MICROSCOPIC ONLY
Crystals: NONE SEEN
Squamous Epithelial / LPF: NONE SEEN

## 2013-02-22 LAB — URINALYSIS, ROUTINE W REFLEX MICROSCOPIC
Glucose, UA: NEGATIVE mg/dL
Hgb urine dipstick: NEGATIVE
Specific Gravity, Urine: >1.03 — ABNORMAL HIGH (ref 1.005–1.030)
pH: 6 (ref 5.0–8.0)

## 2013-02-22 NOTE — Telephone Encounter (Signed)
Called patient and advised him of Dr Orvan Falconer suggestions and no to the pain meds other than OTC and he advised will try and he fopes the swelling goes down soon.

## 2013-02-22 NOTE — Telephone Encounter (Signed)
Patient called and advised that he is still having pain in the testicle and the tylenol is not working and he wants to have something stronger called in. He advised just a few days of something would help. I advised the patient that [name] is in clinic and as soon as he has a chance he will check his messages and give me a response and I will call him back.   *Before I could document this call he called back as he is in a lot of pain. He can not close his legs and it hurts to sit down.

## 2013-02-22 NOTE — Telephone Encounter (Signed)
Tell him to use a nonsteroidal anti-inflammatory agent such as ibuprofen or Naprosyn rather than acetaminophen. This is more likely to be effective than acetaminophen. He should also use a pillow to elevate his scrotum when he is laying down and apply ice for 5-10 minutes every hour.

## 2013-02-23 LAB — URINE CULTURE: Organism ID, Bacteria: NO GROWTH

## 2013-03-02 ENCOUNTER — Other Ambulatory Visit: Payer: Self-pay

## 2013-03-02 ENCOUNTER — Ambulatory Visit: Payer: Self-pay

## 2013-03-16 ENCOUNTER — Other Ambulatory Visit (INDEPENDENT_AMBULATORY_CARE_PROVIDER_SITE_OTHER): Payer: Self-pay

## 2013-03-16 ENCOUNTER — Ambulatory Visit: Payer: Self-pay | Admitting: Internal Medicine

## 2013-03-16 ENCOUNTER — Other Ambulatory Visit: Payer: Self-pay | Admitting: Internal Medicine

## 2013-03-16 DIAGNOSIS — B20 Human immunodeficiency virus [HIV] disease: Secondary | ICD-10-CM

## 2013-03-17 LAB — T-HELPER CELL (CD4) - (RCID CLINIC ONLY)
CD4 % Helper T Cell: 18 % — ABNORMAL LOW (ref 33–55)
CD4 T Cell Abs: 400 uL (ref 400–2700)

## 2013-04-04 ENCOUNTER — Ambulatory Visit: Payer: Self-pay | Admitting: Internal Medicine

## 2013-04-04 ENCOUNTER — Telehealth: Payer: Self-pay | Admitting: *Deleted

## 2013-04-04 NOTE — Telephone Encounter (Signed)
Called and left patient a voice mail to call the clinic to reschedule his appt, he no showed today. Jose Carter  

## 2013-04-05 ENCOUNTER — Encounter: Payer: Self-pay | Admitting: *Deleted

## 2013-04-11 ENCOUNTER — Encounter: Payer: Self-pay | Admitting: Internal Medicine

## 2013-04-11 ENCOUNTER — Ambulatory Visit (INDEPENDENT_AMBULATORY_CARE_PROVIDER_SITE_OTHER): Payer: Self-pay | Admitting: Internal Medicine

## 2013-04-11 VITALS — BP 139/83 | HR 73 | Temp 97.8°F | Ht 73.0 in | Wt 222.0 lb

## 2013-04-11 DIAGNOSIS — N451 Epididymitis: Secondary | ICD-10-CM

## 2013-04-11 DIAGNOSIS — Z113 Encounter for screening for infections with a predominantly sexual mode of transmission: Secondary | ICD-10-CM

## 2013-04-11 DIAGNOSIS — Z79899 Other long term (current) drug therapy: Secondary | ICD-10-CM

## 2013-04-11 DIAGNOSIS — N453 Epididymo-orchitis: Secondary | ICD-10-CM

## 2013-04-11 DIAGNOSIS — F1910 Other psychoactive substance abuse, uncomplicated: Secondary | ICD-10-CM

## 2013-04-11 DIAGNOSIS — B20 Human immunodeficiency virus [HIV] disease: Secondary | ICD-10-CM

## 2013-04-11 NOTE — Progress Notes (Signed)
  Subjective:    Patient ID: Jose Carter, male    DOB: 1976-07-07, 37 y.o.   MRN: [id number]  HPI He comes in here for follow up of his HIV. He was last seen by my partner who diagnosed him with epididymitis and gave him appropriate treatment. That has since resolved. He did have a problem with running out of his medications however he did restart and he is undetectable with a good CD4 count.  He continues to take Reyataz, Norvir and Truvada. He feels well. He denies any missed doses since restarting.  He is having problems with relapse of drug and alcohol abuse. He has a long history of struggles with substance abuse.     Review of Systems  Constitutional: Negative for fever, appetite change and fatigue.  HENT: Negative for sore throat and trouble swallowing.   Respiratory: Negative for shortness of breath and stridor.   Cardiovascular: Negative for chest pain.  Gastrointestinal: Negative for nausea, abdominal pain and diarrhea.  Musculoskeletal: Negative for myalgias, joint swelling and arthralgias.  Neurological: Negative for dizziness, light-headedness and headaches.  Hematological: Negative for adenopathy.       Objective:   Physical Exam  Constitutional: He appears well-developed and well-nourished. No distress.  Cardiovascular: Normal rate, regular rhythm and normal heart sounds.  Exam reveals no gallop and no friction rub.   No murmur heard. Pulmonary/Chest: Effort normal and breath sounds normal. No respiratory distress. He has no wheezes. He has no rales.  Abdominal: Soft. Bowel sounds are normal. He exhibits no distension. There is no tenderness. There is no rebound.          Assessment & Plan:

## 2013-04-11 NOTE — Assessment & Plan Note (Signed)
He is doing well with his regimen despite having missed some doses. I will have him return in about 3 months to keep a close eye and his virus after having missed doses. He no otherwise doing well. He was reminded to use condoms with sexual activity.

## 2013-04-11 NOTE — Assessment & Plan Note (Signed)
This has resolved.

## 2013-04-11 NOTE — Assessment & Plan Note (Signed)
He continues to struggle a substance abuse and is interested in reinitiating counseling. He was seen by our substance abuse counselor today and will followup with him.

## 2013-04-18 ENCOUNTER — Ambulatory Visit: Payer: Self-pay | Admitting: *Deleted

## 2013-04-18 DIAGNOSIS — F1910 Other psychoactive substance abuse, uncomplicated: Secondary | ICD-10-CM

## 2013-04-18 NOTE — Progress Notes (Signed)
Patient ID: [name], male   DOB: [birth year], 37 y.o.   MRN: [id number] Jose Carter made first scheduled session with Clinical research associate. He presented oriented x 4 and quickly engaged in discussion of his substance abuse problem and related history. 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 Consequently, Jose Carter has tested + for cocaine with his Research scientist (life sciences), Jose Carter, and is in jeopardy of incarceration if he does not sustain a renewed recovery effort and produce negative UA results.   Writer and patient processed resuming NA meetings participation which patient has voiced motivation to do. Jose Carter did not know how to secure an NA sponsor so we addressed that - as well as confirming his appointment next week with Amy Venetia Maxon of [facility] so that he can re-enter structured substance abuse treatment. Writer reinforced Jose Carter's plan to re-enter outpatient SA treatment. Patient was receptive to support & counselor validation. Writer processed with patient that he has several unmanaged relapse triggers that are specifically compromising his desired sobriety. Jose Carter expressed awareness of the need to develop a better recovery plan for dealing with drug use triggers. He will see this Clinical research associate again on May 13. Writer will review at that time patient's success in resuming NA meetings and in meeting with Eliezer Lofts for scheduling of his outpatient SA treatment.   Writer will relay this plan to Dr. Luciana Axe as well as patient's follow-through with the plan moving forward. Today's interventions were successful as evidenced by patient efforts at active planning and his expressed agreement in pursuing sobriety-building strategies.

## 2013-05-02 ENCOUNTER — Ambulatory Visit: Payer: Self-pay | Admitting: *Deleted

## 2013-05-02 ENCOUNTER — Telehealth: Payer: Self-pay | Admitting: *Deleted

## 2013-05-02 NOTE — Telephone Encounter (Signed)
Left message for pt due to no-show for today's appt. Pt can reschedule.

## 2013-06-28 ENCOUNTER — Other Ambulatory Visit: Payer: Self-pay

## 2013-07-05 ENCOUNTER — Encounter: Payer: Self-pay | Admitting: *Deleted

## 2013-07-05 DIAGNOSIS — F1910 Other psychoactive substance abuse, uncomplicated: Secondary | ICD-10-CM
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
## 2013-07-11 ENCOUNTER — Telehealth: Payer: Self-pay | Admitting: *Deleted

## 2013-07-11 ENCOUNTER — Ambulatory Visit: Payer: Self-pay | Admitting: Internal Medicine

## 2013-07-11 NOTE — Telephone Encounter (Signed)
Unable to contact patient re: today's no-show.  Pt incarcerated. Andree Coss, RN

## 2013-08-30 ENCOUNTER — Other Ambulatory Visit: Payer: Self-pay

## 2013-09-04 ENCOUNTER — Other Ambulatory Visit: Payer: Self-pay

## 2013-11-08 ENCOUNTER — Other Ambulatory Visit (INDEPENDENT_AMBULATORY_CARE_PROVIDER_SITE_OTHER): Payer: Self-pay

## 2013-11-08 ENCOUNTER — Other Ambulatory Visit: Payer: Self-pay | Admitting: *Deleted

## 2013-11-08 DIAGNOSIS — Z113 Encounter for screening for infections with a predominantly sexual mode of transmission: Secondary | ICD-10-CM

## 2013-11-08 DIAGNOSIS — B20 Human immunodeficiency virus [HIV] disease: Secondary | ICD-10-CM

## 2013-11-08 DIAGNOSIS — Z79899 Other long term (current) drug therapy: Secondary | ICD-10-CM

## 2013-11-08 LAB — COMPREHENSIVE METABOLIC PANEL
BUN: 16 mg/dL (ref 6–23)
CO2: 29 mEq/L (ref 19–32)
Glucose, Bld: 86 mg/dL (ref 70–99)
Sodium: 140 mEq/L (ref 135–145)
Total Bilirubin: 0.3 mg/dL (ref 0.3–1.2)
Total Protein: 7.1 g/dL (ref 6.0–8.3)

## 2013-11-08 LAB — CBC WITH DIFFERENTIAL/PLATELET
Eosinophils Absolute: 0.1 10*3/uL (ref 0.0–0.7)
Eosinophils Relative: 1 % (ref 0–5)
HCT: 40.7 % (ref 39.0–52.0)
Hemoglobin: 13.9 g/dL (ref 13.0–17.0)
Lymphs Abs: 2.2 10*3/uL (ref 0.7–4.0)
MCH: 27.7 pg (ref 26.0–34.0)
MCHC: 34.2 g/dL (ref 30.0–36.0)
MCV: 81.2 fL (ref 78.0–100.0)
Monocytes Absolute: 0.9 10*3/uL (ref 0.1–1.0)
Monocytes Relative: 16 % — ABNORMAL HIGH (ref 3–12)
Neutrophils Relative %: 45 % (ref 43–77)
RBC: 5.01 MIL/uL (ref 4.22–5.81)

## 2013-11-08 LAB — LIPID PANEL
Cholesterol: 141 mg/dL (ref 0–200)
Total CHOL/HDL Ratio: 4.4 Ratio
Triglycerides: 141 mg/dL (ref <150)

## 2013-11-08 LAB — RPR

## 2013-11-08 MED ORDER — RITONAVIR 100 MG PO CAPS: 100.0000 mg | ORAL_CAPSULE | Freq: Every day | ORAL | Status: DC

## 2013-11-08 MED ORDER — ATAZANAVIR SULFATE 300 MG PO CAPS: 300.0000 mg | ORAL_CAPSULE | Freq: Every day | ORAL | Status: DC

## 2013-11-08 MED ORDER — EMTRICITABINE-TENOFOVIR DF 200-300 MG PO TABS: 1.0000 | ORAL_TABLET | Freq: Every day | ORAL | Status: DC

## 2013-11-09 LAB — T-HELPER CELL (CD4) - (RCID CLINIC ONLY): CD4 % Helper T Cell: 15 % — ABNORMAL LOW (ref 33–55)

## 2013-11-23 ENCOUNTER — Telehealth: Payer: Self-pay | Admitting: *Deleted

## 2013-11-29 NOTE — Telephone Encounter (Signed)
Pt has already started his medications.  ADAP approval has come through.  Pt has f/u appt w/ Dr. Luciana Axe for 11/30/13.

## 2013-11-30 ENCOUNTER — Ambulatory Visit: Payer: Self-pay | Admitting: Internal Medicine

## 2014-03-15 ENCOUNTER — Telehealth: Payer: Self-pay | Admitting: *Deleted

## 2014-03-15 NOTE — Telephone Encounter (Signed)
[facility], detectable list.

## 2014-03-15 NOTE — Telephone Encounter (Signed)
Message copied by Macy MisOCKERHAM, JACQUELINE A on Thu Mar 15, 2014  9:03 AM ------      Message from: Gardiner BarefootOMER, ROBERT W      Created: Thu Mar 15, 2014  7:43 AM       Yes please      ----- Message -----         From: Wendall MolaJacqueline Cockerham, CMA         Sent: 03/14/2014   4:55 PM           To: Gardiner Barefootobert W [name], MD            We do not have a working phone number for him. Do you [facility]?      ----- Message -----         From: Gardiner Barefootobert W [name], MD         Sent: 03/14/2014   3:35 PM           To: [facility] Triage Nurse Pool            Have we been able to get ahold of him?  He renewed ADAP but has not been in.               ------

## 2014-11-22 ENCOUNTER — Ambulatory Visit (INDEPENDENT_AMBULATORY_CARE_PROVIDER_SITE_OTHER): Payer: Self-pay | Admitting: *Deleted

## 2014-11-22 ENCOUNTER — Ambulatory Visit (INDEPENDENT_AMBULATORY_CARE_PROVIDER_SITE_OTHER): Payer: PRIVATE HEALTH INSURANCE | Admitting: Internal Medicine

## 2014-11-22 ENCOUNTER — Encounter: Payer: Self-pay | Admitting: Internal Medicine

## 2014-11-22 VITALS — BP 124/80 | HR 56 | Temp 97.5°F | Ht 73.0 in | Wt 226.0 lb

## 2014-11-22 DIAGNOSIS — N451 Epididymitis: Secondary | ICD-10-CM | POA: Diagnosis not present

## 2014-11-22 DIAGNOSIS — Z113 Encounter for screening for infections with a predominantly sexual mode of transmission: Secondary | ICD-10-CM | POA: Diagnosis not present

## 2014-11-22 DIAGNOSIS — B20 Human immunodeficiency virus [HIV] disease: Secondary | ICD-10-CM | POA: Diagnosis not present

## 2014-11-22 DIAGNOSIS — Z23 Encounter for immunization: Secondary | ICD-10-CM

## 2014-11-22 DIAGNOSIS — Z79899 Other long term (current) drug therapy: Secondary | ICD-10-CM | POA: Insufficient documentation

## 2014-11-22 DIAGNOSIS — R21 Rash and other nonspecific skin eruption: Secondary | ICD-10-CM | POA: Insufficient documentation

## 2014-11-22 DIAGNOSIS — N289 Disorder of kidney and ureter, unspecified: Secondary | ICD-10-CM

## 2014-11-22 LAB — CBC WITH DIFFERENTIAL/PLATELET
Basophils Absolute: 0.1 10*3/uL (ref 0.0–0.1)
Basophils Relative: 1 % (ref 0–1)
EOS ABS: 0.3 10*3/uL (ref 0.0–0.7)
EOS PCT: 4 % (ref 0–5)
HEMATOCRIT: 38.4 % — AB (ref 39.0–52.0)
Hemoglobin: 13.1 g/dL (ref 13.0–17.0)
LYMPHS ABS: 2.3 10*3/uL (ref 0.7–4.0)
Lymphocytes Relative: 35 % (ref 12–46)
MCH: 27.8 pg (ref 26.0–34.0)
MCHC: 34.1 g/dL (ref 30.0–36.0)
MCV: 81.4 fL (ref 78.0–100.0)
MPV: 11.5 fL (ref 9.4–12.4)
Monocytes Absolute: 0.5 10*3/uL (ref 0.1–1.0)
Monocytes Relative: 8 % (ref 3–12)
NEUTROS PCT: 52 % (ref 43–77)
Neutro Abs: 3.4 10*3/uL (ref 1.7–7.7)
Platelets: 208 10*3/uL (ref 150–400)
RBC: 4.72 MIL/uL (ref 4.22–5.81)
RDW: 14.9 % (ref 11.5–15.5)
WBC: 6.5 10*3/uL (ref 4.0–10.5)

## 2014-11-22 LAB — COMPLETE METABOLIC PANEL WITH GFR
ALT: 16 U/L (ref 0–53)
AST: 29 U/L (ref 0–37)
Albumin: 3.8 g/dL (ref 3.5–5.2)
Alkaline Phosphatase: 67 U/L (ref 39–117)
BILIRUBIN TOTAL: 2.1 mg/dL — AB (ref 0.2–1.2)
BUN: 15 mg/dL (ref 6–23)
CO2: 26 meq/L (ref 19–32)
Calcium: 9.2 mg/dL (ref 8.4–10.5)
Chloride: 103 mEq/L (ref 96–112)
Creat: 1.27 mg/dL (ref 0.50–1.35)
GFR, EST AFRICAN AMERICAN: 83 mL/min
GFR, EST NON AFRICAN AMERICAN: 72 mL/min
Glucose, Bld: 83 mg/dL (ref 70–99)
Potassium: 4.8 mEq/L (ref 3.5–5.3)
SODIUM: 137 meq/L (ref 135–145)
TOTAL PROTEIN: 6.8 g/dL (ref 6.0–8.3)

## 2014-11-22 LAB — LIPID PANEL
Cholesterol: 101 mg/dL (ref 0–200)
HDL: 26 mg/dL — AB (ref >39)
LDL Cholesterol: 50 mg/dL (ref 0–99)
Total CHOL/HDL Ratio: 3.9 Ratio
Triglycerides: 127 mg/dL (ref <150)
VLDL: 25 mg/dL (ref 0–40)

## 2014-11-22 LAB — RPR

## 2014-11-22 MED ORDER — TRIAMCINOLONE ACETONIDE 0.025 % EX OINT: 1.0000 "application " | TOPICAL_OINTMENT | Freq: Two times a day (BID) | CUTANEOUS | Status: DC

## 2014-11-22 MED ORDER — ELVITEG-COBIC-EMTRICIT-TENOFDF 150-150-200-300 MG PO TABS: 1.0000 | ORAL_TABLET | Freq: Every day | ORAL | Status: DC

## 2014-11-22 NOTE — Assessment & Plan Note (Signed)
I discussed the options and i will change him to Strbild.  He did have some mild renal insufficiency in the past but still ok and will be better with Genvoya once available.  Will do labs today as well to be sure creat still ok and no resistance from his inadequate lamivudine dosing (150 mg daily).   Will see him again in 3 months.

## 2014-11-22 NOTE — Assessment & Plan Note (Signed)
Will recheck creat now

## 2014-11-22 NOTE — Assessment & Plan Note (Signed)
Not sure if testicular growth is concerning or not.  Will recommend referral to urology to evaluate.

## 2014-11-22 NOTE — Assessment & Plan Note (Signed)
triamcinolone

## 2014-11-22 NOTE — Progress Notes (Signed)
   Subjective:    Patient ID: Jose Carter, male    DOB: October 09, 1976, 38 y.o.   MRN: [id number]  HPI He is here for follow up after being out of care due to jail.  He was last seen over 1 year ago and was on Reyataz, norvir and Truvada.  He was changed during a state of [place] jail to Reyataz norvir with lamivudine and tenofovir.  He has essentially remained on meds since and is hopeful to go to a one pill a day regimen.  He has had some weight loss.  [place] now in [place]  He continues to have a testicular growth after his diangosis of epidydimitis.  Has remained constant with no increase.      Review of Systems  Constitutional: Negative for fatigue.  HENT: Negative for trouble swallowing.   Respiratory: Negative for shortness of breath.   Gastrointestinal: Negative for nausea and diarrhea.  Skin: Negative for rash.  Neurological: Negative for dizziness and headaches.  Psychiatric/Behavioral: Negative for dysphoric mood.       Objective:   Physical Exam  Constitutional: He appears well-developed and well-nourished. No distress.  HENT:  Mouth/Throat: No oropharyngeal exudate.  Eyes: No scleral icterus.  Cardiovascular: Normal rate, regular rhythm and normal heart sounds.   No murmur heard. Pulmonary/Chest: Effort normal and breath sounds normal. No respiratory distress.  Lymphadenopathy:    He has no cervical adenopathy.  Skin: No rash noted.          Assessment & Plan:

## 2014-11-23 LAB — T-HELPER CELL (CD4) - (RCID CLINIC ONLY)
CD4 T CELL ABS: 410 /uL (ref 400–2700)
CD4 T CELL HELPER: 18 % — AB (ref 33–55)

## 2014-11-24 LAB — HIV-1 RNA QUANT-NO REFLEX-BLD
HIV 1 RNA Quant: <20 copies/mL (ref <20)
HIV-1 RNA Quant, Log: <1.3 {Log} (ref <1.30)

## 2014-11-28 LAB — HLA B*5701: HLA-B*5701 w/rflx HLA-B High: NEGATIVE

## 2015-01-08 ENCOUNTER — Telehealth: Payer: Self-pay | Admitting: Internal Medicine
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
## 2015-02-21 ENCOUNTER — Ambulatory Visit: Payer: Self-pay | Admitting: Internal Medicine

## 2015-05-21 ENCOUNTER — Ambulatory Visit: Payer: Self-pay | Admitting: Internal Medicine

## 2017-05-31 ENCOUNTER — Ambulatory Visit: Payer: Self-pay

## 2017-06-01 ENCOUNTER — Encounter: Payer: Self-pay | Admitting: Internal Medicine

## 2017-06-10 ENCOUNTER — Telehealth: Payer: Self-pay | Admitting: *Deleted

## 2017-06-10 ENCOUNTER — Other Ambulatory Visit: Payer: Self-pay | Admitting: *Deleted

## 2017-06-10 NOTE — Telephone Encounter (Signed)
Call from [facility] [place] requesting Rx for Stribild be sent as patient's ADAP is approved. Advised that patient has a lab appt on 06/16/17 and MD appt on 07/01/17. He has not been to [facility] since 2015. He may have previously been incarcerated. If he comes in for labs on 6.27.18 he should see pharmacy and find out if it is appropriate to refill his meds before the MD visit. Wendall MolaJacqueline Tashi Andujo

## 2017-06-16 ENCOUNTER — Other Ambulatory Visit: Payer: Self-pay | Admitting: Internal Medicine

## 2017-06-16 ENCOUNTER — Other Ambulatory Visit: Payer: Self-pay

## 2017-06-16 DIAGNOSIS — B20 Human immunodeficiency virus [HIV] disease: Secondary | ICD-10-CM

## 2017-06-16 LAB — CBC WITH DIFFERENTIAL/PLATELET
BASOS PCT: 1 %
Basophils Absolute: 54 cells/uL (ref 0–200)
EOS ABS: 162 {cells}/uL (ref 15–500)
Eosinophils Relative: 3 %
HEMATOCRIT: 41.3 % (ref 38.5–50.0)
Hemoglobin: 13.7 g/dL (ref 13.2–17.1)
LYMPHS PCT: 50 %
Lymphs Abs: 2700 cells/uL (ref 850–3900)
MCH: 28 pg (ref 27.0–33.0)
MCHC: 33.2 g/dL (ref 32.0–36.0)
MCV: 84.5 fL (ref 80.0–100.0)
MONO ABS: 486 {cells}/uL (ref 200–950)
MONOS PCT: 9 %
MPV: 11.5 fL (ref 7.5–12.5)
NEUTROS ABS: 1998 {cells}/uL (ref 1500–7800)
Neutrophils Relative %: 37 %
PLATELETS: 220 10*3/uL (ref 140–400)
RBC: 4.89 MIL/uL (ref 4.20–5.80)
RDW: 15.8 % — AB (ref 11.0–15.0)
WBC: 5.4 10*3/uL (ref 3.8–10.8)

## 2017-06-16 LAB — T-HELPER CELL (CD4) - (RCID CLINIC ONLY)
CD4 % Helper T Cell: 26 % — ABNORMAL LOW (ref 33–55)
CD4 T Cell Abs: 720 /uL (ref 400–2700)

## 2017-06-16 LAB — COMPLETE METABOLIC PANEL WITH GFR
ALT: 17 U/L (ref 9–46)
AST: 19 U/L (ref 10–40)
Albumin: 4 g/dL (ref 3.6–5.1)
Alkaline Phosphatase: 71 U/L (ref 40–115)
BILIRUBIN TOTAL: 0.4 mg/dL (ref 0.2–1.2)
BUN: 18 mg/dL (ref 7–25)
CHLORIDE: 104 mmol/L (ref 98–110)
CO2: 26 mmol/L (ref 20–31)
CREATININE: 1.62 mg/dL — AB (ref 0.60–1.35)
Calcium: 9.3 mg/dL (ref 8.6–10.3)
GFR, EST AFRICAN AMERICAN: 61 mL/min (ref >=60)
GFR, Est Non African American: 53 mL/min — ABNORMAL LOW (ref >=60)
Glucose, Bld: 113 mg/dL — ABNORMAL HIGH (ref 65–99)
Potassium: 4.2 mmol/L (ref 3.5–5.3)
Sodium: 138 mmol/L (ref 135–146)
TOTAL PROTEIN: 6.8 g/dL (ref 6.1–8.1)

## 2017-06-16 LAB — LIPID PANEL
CHOL/HDL RATIO: 4.2 ratio (ref <5.0)
Cholesterol: 159 mg/dL (ref <200)
HDL: 38 mg/dL — ABNORMAL LOW (ref >40)
LDL CALC: 97 mg/dL (ref <100)
Triglycerides: 120 mg/dL (ref <150)
VLDL: 24 mg/dL (ref <30)

## 2017-06-17 LAB — URINE CYTOLOGY ANCILLARY ONLY
Chlamydia: NEGATIVE
Neisseria Gonorrhea: NEGATIVE

## 2017-06-17 LAB — RPR

## 2017-06-19 LAB — HIV-1 RNA,QN PCR W/REFLEX GENOTYPE: HIV-1 RNA, QN PCR: <20 Copies/mL

## 2017-06-20 ENCOUNTER — Other Ambulatory Visit: Payer: Self-pay | Admitting: Pharmacist Clinician (PhC)/ Clinical Pharmacy Specialist

## 2017-06-20 MED ORDER — ELVITEG-COBIC-EMTRICIT-TENOFAF 150-150-200-10 MG PO TABS: 1.0000 | ORAL_TABLET | Freq: Every day | ORAL | 1 refills | Status: DC

## 2017-06-20 NOTE — Telephone Encounter (Signed)
Looks like he is controlled so sent in genvoya with 1 refill until his appt with Dr. Luciana Axeomer.

## 2017-06-21 NOTE — Telephone Encounter (Signed)
Thanks Minh! 

## 2017-07-01 ENCOUNTER — Ambulatory Visit: Payer: Self-pay | Admitting: Internal Medicine

## 2017-07-01 ENCOUNTER — Ambulatory Visit: Payer: Self-pay

## 2017-08-04 ENCOUNTER — Ambulatory Visit (INDEPENDENT_AMBULATORY_CARE_PROVIDER_SITE_OTHER): Payer: Self-pay | Admitting: Internal Medicine

## 2017-08-04 ENCOUNTER — Ambulatory Visit: Payer: Self-pay

## 2017-08-04 ENCOUNTER — Encounter: Payer: Self-pay | Admitting: Internal Medicine

## 2017-08-04 VITALS — BP 153/81 | HR 71 | Temp 98.0°F | Wt 249.8 lb

## 2017-08-04 DIAGNOSIS — Z7189 Other specified counseling: Secondary | ICD-10-CM

## 2017-08-04 DIAGNOSIS — N289 Disorder of kidney and ureter, unspecified: Secondary | ICD-10-CM

## 2017-08-04 DIAGNOSIS — Z23 Encounter for immunization: Secondary | ICD-10-CM

## 2017-08-04 DIAGNOSIS — B20 Human immunodeficiency virus [HIV] disease: Secondary | ICD-10-CM

## 2017-08-04 DIAGNOSIS — R5383 Other fatigue: Secondary | ICD-10-CM

## 2017-08-04 DIAGNOSIS — Z7185 Encounter for immunization safety counseling: Secondary | ICD-10-CM | POA: Insufficient documentation

## 2017-08-04 MED ORDER — ELVITEG-COBIC-EMTRICIT-TENOFAF 150-150-200-10 MG PO TABS: 1.0000 | ORAL_TABLET | Freq: Every day | ORAL | 11 refills | Status: DC

## 2017-08-04 NOTE — Assessment & Plan Note (Signed)
Pneumovax and Menveo today

## 2017-08-04 NOTE — Assessment & Plan Note (Signed)
This has been stable but hopefully will improve some [facility]

## 2017-08-04 NOTE — Progress Notes (Signed)
   Subjective:    Patient ID: Jose Carter, male    DOB: 12-31-1975, 41 y.o.   MRN: [id number]  HPI Follow up of HIV Here after a 3 year absence from being in jail.  He was previously on Reyataz, norvir and Truvada then changed to Stribild then [place] and then continued on Stribild in jail.  He has no associated n/v/d.  Takes daily with no missed doses.  Has renewed ADAP today He started taking testosterone replacement on his own and feel she has more energy.     Review of Systems  Constitutional: Negative for fatigue.  Gastrointestinal: Negative for diarrhea.  Skin: Negative for rash.  Neurological: Negative for dizziness.       Objective:   Physical Exam  Constitutional: He appears well-developed and well-nourished. No distress.  HENT:  Mouth/Throat: No oropharyngeal exudate.  Eyes: No scleral icterus.  Cardiovascular: Normal rate, regular rhythm and normal heart sounds.   No murmur heard. Pulmonary/Chest: Effort normal and breath sounds normal. No respiratory distress. He has no wheezes.  Lymphadenopathy:    He has no cervical adenopathy.  Skin: No rash noted.   SH: now out of jail; stopped smoking       Assessment & Plan:

## 2017-08-04 NOTE — Assessment & Plan Note (Signed)
I advised against him taking testosterone and do not feel it is appropriate to prescribe for him.  I discussed the risks and no true benefit.

## 2017-08-04 NOTE — Assessment & Plan Note (Signed)
Will continue with Genvoya.  rtc 4 months.

## 2017-08-13 ENCOUNTER — Encounter: Payer: Self-pay | Admitting: Internal Medicine

## 2017-11-23 ENCOUNTER — Other Ambulatory Visit: Payer: Self-pay

## 2017-12-01 ENCOUNTER — Other Ambulatory Visit: Payer: Self-pay

## 2017-12-07 ENCOUNTER — Ambulatory Visit: Payer: Self-pay | Admitting: Internal Medicine

## 2017-12-15 ENCOUNTER — Other Ambulatory Visit: Payer: Self-pay

## 2017-12-30 ENCOUNTER — Ambulatory Visit (INDEPENDENT_AMBULATORY_CARE_PROVIDER_SITE_OTHER): Payer: Self-pay | Admitting: Internal Medicine

## 2017-12-30 ENCOUNTER — Encounter: Payer: Self-pay | Admitting: Internal Medicine

## 2017-12-30 ENCOUNTER — Other Ambulatory Visit: Payer: Self-pay

## 2017-12-30 VITALS — BP 140/91 | HR 63 | Temp 97.5°F | Ht 73.0 in | Wt 267.0 lb

## 2017-12-30 DIAGNOSIS — Z7189 Other specified counseling: Secondary | ICD-10-CM

## 2017-12-30 DIAGNOSIS — B20 Human immunodeficiency virus [HIV] disease: Secondary | ICD-10-CM

## 2017-12-30 DIAGNOSIS — Z7185 Encounter for immunization safety counseling: Secondary | ICD-10-CM

## 2017-12-30 DIAGNOSIS — Z720 Tobacco use: Secondary | ICD-10-CM

## 2017-12-30 DIAGNOSIS — R635 Abnormal weight gain: Secondary | ICD-10-CM

## 2017-12-30 LAB — COMPLETE METABOLIC PANEL WITH GFR
AG Ratio: 1.3 (calc) (ref 1.0–2.5)
ALKALINE PHOSPHATASE (APISO): 72 U/L (ref 40–115)
ALT: 17 U/L (ref 9–46)
AST: 21 U/L (ref 10–40)
Albumin: 4 g/dL (ref 3.6–5.1)
BILIRUBIN TOTAL: 0.4 mg/dL (ref 0.2–1.2)
BUN/Creatinine Ratio: 8 (calc) (ref 6–22)
BUN: 13 mg/dL (ref 7–25)
CHLORIDE: 104 mmol/L (ref 98–110)
CO2: 28 mmol/L (ref 20–32)
Calcium: 9.1 mg/dL (ref 8.6–10.3)
Creat: 1.62 mg/dL — ABNORMAL HIGH (ref 0.60–1.35)
GFR, Est African American: 61 mL/min/{1.73_m2} (ref >=60)
GFR, Est Non African American: 52 mL/min/{1.73_m2} — ABNORMAL LOW (ref >=60)
GLUCOSE: 81 mg/dL (ref 65–99)
Globulin: 3 g/dL (calc) (ref 1.9–3.7)
Potassium: 3.9 mmol/L (ref 3.5–5.3)
Sodium: 139 mmol/L (ref 135–146)
Total Protein: 7 g/dL (ref 6.1–8.1)

## 2017-12-31 DIAGNOSIS — Z720 Tobacco use: Secondary | ICD-10-CM | POA: Insufficient documentation

## 2017-12-31 DIAGNOSIS — R635 Abnormal weight gain: Secondary | ICD-10-CM | POA: Insufficient documentation

## 2017-12-31 LAB — T-HELPER CELL (CD4) - (RCID CLINIC ONLY)
CD4 T CELL HELPER: 23 % — AB (ref 33–55)
CD4 T Cell Abs: 650 /uL (ref 400–2700)

## 2017-12-31 NOTE — Assessment & Plan Note (Signed)
Possibly worsening.  Will check labs today and rtc 2 months and will recheck again.

## 2017-12-31 NOTE — Assessment & Plan Note (Signed)
I have advised him to quit smoking, particularly with testosterone use.

## 2017-12-31 NOTE — Assessment & Plan Note (Signed)
Likely from testosterone use.  I have advised against diet pills and have advised against testosterone use due to weight gain, clot risk, including PE

## 2017-12-31 NOTE — Progress Notes (Signed)
   Subjective:    Patient ID: Jose Carter, male    DOB: February 23, 1976, 42 y.o.   MRN: [id number]  HPI Here for follow up of HIV He continues on Genvoya.  Has missed some doses. No labs prior to visit.  Feels well.  He continues to take testosterone on his own.  Also smokes.  He takes a baby aspirin daily with clot risk.  Has gained some weight.  No associated n/v/d.  Asking about diet supplements with his recent weight gain.   Review of Systems  Constitutional: Negative for fatigue.  Gastrointestinal: Negative for diarrhea.  Skin: Negative for rash.       Objective:   Physical Exam  Constitutional: He appears well-developed and well-nourished. No distress.  HENT:  Mouth/Throat: No oropharyngeal exudate.  Eyes: No scleral icterus.  Cardiovascular: Normal rate, regular rhythm and normal heart sounds.  No murmur heard. Pulmonary/Chest: Effort normal and breath sounds normal. No respiratory distress.  Lymphadenopathy:    He has no cervical adenopathy.  Skin: No rash noted.   SH: + tobacco       Assessment & Plan:

## 2017-12-31 NOTE — Assessment & Plan Note (Signed)
Counseled on flu shot, refused

## 2018-01-01 LAB — HIV-1 RNA ULTRAQUANT REFLEX TO GENTYP+
HIV 1 RNA QUANT: 49 {copies}/mL — AB
HIV-1 RNA QUANT, LOG: 1.69 {Log_copies}/mL — AB

## 2018-01-06 ENCOUNTER — Telehealth: Payer: Self-pay | Admitting: Licensed Clinical Social Worker

## 2018-01-06 NOTE — Telephone Encounter (Signed)
Patient called back to schedule appointment for Wed Jan 23 at 8:45 pm.  Vergia AlbertsSherry Hadas Jessop, [facility]

## 2018-01-06 NOTE — Telephone Encounter (Signed)
[facility] and left a message regarding arranging for an appointment.  Vergia AlbertsSherry Katrianna Friesenhahn, [facility]

## 2018-01-11 ENCOUNTER — Encounter: Payer: Self-pay | Admitting: Internal Medicine

## 2018-01-12 ENCOUNTER — Ambulatory Visit (INDEPENDENT_AMBULATORY_CARE_PROVIDER_SITE_OTHER): Payer: PRIVATE HEALTH INSURANCE | Admitting: Licensed Clinical Social Worker

## 2018-01-12 DIAGNOSIS — F1420 Cocaine dependence, uncomplicated: Secondary | ICD-10-CM

## 2018-01-12 DIAGNOSIS — Z634 Disappearance and death of family member: Secondary | ICD-10-CM

## 2018-01-14 NOTE — BH Specialist Note (Signed)
Integrated Behavioral Health Initial Visit  MRN: [id number] Name: Jose SkillBart K Moulder  Number of Integrated Behavioral Health Clinician visits:: 1/6 Session Start time: 2:44 pm  Session End time: 3:10 pm Total time: 25 mins  Type of Service: Integrated Behavioral Health- Individual/Family Interpretor:No. Interpretor Name and Language: N/A   Warm Hand Off Completed.       SUBJECTIVE: Jose Carter is a 42 y.o. male accompanied by self Patient was self-referred due to substance use, and is a patient of Dr. Luciana Axeomer.  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  Patient reported binge-using, going on 24 hour binges, using with his friends.  Patient reported that he has tried to stop using by going to groups but has been unsuccessful.  Patient was aware of the consequences of his use, which is that he does not take his HIV medications when he is high.  Patient reported that [facility], and was discharged successfully after 7 days, which led to one year of sobriety.  Patient reported his longest sobriety without treatment as 3 days.  Patient reported that he currently wants treatment and is willing to go anywhere and is willing to be placed on a wait list.  Intervention:  [facility] and provided patient information and he was placed at number 3 on a wait list.  Intake Coordinator will call patient back when a bed becomes available or patient will call her back on Tuesday, whichever is earlier.  Patient was provided with the contact information and was receptive.  Duration of problem: 20 years of use; Severity of problem: moderate  OBJECTIVE: Mood: Anxious and elevated and Affect: within range Risk of harm to self or others: No plan to harm self or others  LIFE CONTEXT: Family and Social: patient lives with his aunt  and denies using Cocaine in the house.  Patient's mother died in January 2018 and he denied receiving grief counseling. School/Work: Patient is interested in attending barber school to keep busy. Self-Care: Patient is able to tend to his ADL's but becomes treatment noncompliant with taking HIV medications due to substance use. Life Changes: Patient is ready to enter drug treatment for Cocaine use.  ASSESSMENT: Patient is currently experiencing Cocaine use and untreated grief and may benefit from behavioral health counseling, drug treatment, and grief counseling.  GOALS ADDRESSED: Patient will: 1. Reduce symptoms of: substance use and grief 2. Increase knowledge and/or ability of: coping skills, healthy habits and self-management skills  3. Demonstrate ability to: Increase healthy adjustment to current life circumstances, Improve medication compliance, Decrease self-medicating behaviors and Begin healthy grieving over loss  INTERVENTIONS: Interventions utilized: Motivational Interviewing and Link to [facility]   PLAN: 1. Behavioral recommendations: Patient will follow up with Intake Coordinator June for admission. 2. Referral(s): Substance Abuse Program 3. "From scale of 1-10, how likely are you to follow plan?": 10  Jose Carter, [facility]

## 2018-01-18 ENCOUNTER — Telehealth: Payer: Self-pay | Admitting: Licensed Clinical Social Worker

## 2018-01-18 NOTE — Telephone Encounter (Signed)
[facility] if [facility]  Phone went straight to voicemail and [facility].  Vergia AlbertsSherry Karrington Studnicka, [facility]

## 2018-03-08 ENCOUNTER — Ambulatory Visit: Payer: Self-pay | Admitting: Internal Medicine

## 2018-04-14 ENCOUNTER — Other Ambulatory Visit: Payer: Self-pay

## 2018-04-14 ENCOUNTER — Emergency Department (HOSPITAL_COMMUNITY)
Admission: EM | Admit: 2018-04-14 | Discharge: 2018-04-15 | Disposition: A | Discharge status: Home / Self Care | Payer: Self-pay | Attending: Emergency Medicine | Admitting: Emergency Medicine

## 2018-04-14 ENCOUNTER — Encounter (HOSPITAL_COMMUNITY): Payer: Self-pay | Admitting: Emergency Medicine

## 2018-04-14 DIAGNOSIS — Y998 Other external cause status: Secondary | ICD-10-CM | POA: Insufficient documentation

## 2018-04-14 DIAGNOSIS — S01112A Laceration without foreign body of left eyelid and periocular area, initial encounter: Secondary | ICD-10-CM

## 2018-04-14 DIAGNOSIS — X58XXXA Exposure to other specified factors, initial encounter: Secondary | ICD-10-CM | POA: Insufficient documentation

## 2018-04-14 DIAGNOSIS — B20 Human immunodeficiency virus [HIV] disease: Secondary | ICD-10-CM | POA: Insufficient documentation

## 2018-04-14 DIAGNOSIS — Z23 Encounter for immunization: Secondary | ICD-10-CM | POA: Insufficient documentation

## 2018-04-14 DIAGNOSIS — Z79899 Other long term (current) drug therapy: Secondary | ICD-10-CM | POA: Insufficient documentation

## 2018-04-14 DIAGNOSIS — Y9389 Activity, other specified: Secondary | ICD-10-CM | POA: Insufficient documentation

## 2018-04-14 DIAGNOSIS — F17210 Nicotine dependence, cigarettes, uncomplicated: Secondary | ICD-10-CM | POA: Insufficient documentation

## 2018-04-14 DIAGNOSIS — Y929 Unspecified place or not applicable: Secondary | ICD-10-CM | POA: Insufficient documentation
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
## 2018-04-15 ENCOUNTER — Emergency Department (HOSPITAL_COMMUNITY): Payer: Self-pay

## 2018-04-15 MED ORDER — TETANUS-DIPHTH-ACELL PERTUSSIS 5-2.5-18.5 LF-MCG/0.5 IM SUSP
0.5000 mL | Freq: Once | INTRAMUSCULAR | Status: AC
  Administered 2018-04-15: 0.5 mL via INTRAMUSCULAR
  Filled 2018-04-15: qty 0.5

## 2018-04-15 NOTE — Discharge Instructions (Addendum)
You have a broken bone under your left eye. Please call and schedule an appointment for follow up with the ENT doctor (Dr. Jenne PaneBates.)   I've also listed the information to the eye doctor to follow up with the eye Doctor (Dr. Harvel QualeAbugo.)   Please return to the emergency department for any new or concerning symptoms like trouble with your vision, worsening headache, vomiting.
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
## 2018-05-30 ENCOUNTER — Other Ambulatory Visit: Payer: Self-pay

## 2018-05-30 ENCOUNTER — Other Ambulatory Visit: Payer: Self-pay | Admitting: *Deleted

## 2018-05-30 DIAGNOSIS — Z113 Encounter for screening for infections with a predominantly sexual mode of transmission: Secondary | ICD-10-CM

## 2018-05-30 DIAGNOSIS — Z79899 Other long term (current) drug therapy: Secondary | ICD-10-CM

## 2018-05-30 DIAGNOSIS — B20 Human immunodeficiency virus [HIV] disease: Secondary | ICD-10-CM

## 2018-05-31 LAB — CBC WITH DIFFERENTIAL/PLATELET
BASOS PCT: 0.3 %
Basophils Absolute: 18 cells/uL (ref 0–200)
EOS ABS: 232 {cells}/uL (ref 15–500)
Eosinophils Relative: 3.8 %
HCT: 42.2 % (ref 38.5–50.0)
Hemoglobin: 14.4 g/dL (ref 13.2–17.1)
Lymphs Abs: 2898 cells/uL (ref 850–3900)
MCH: 28.2 pg (ref 27.0–33.0)
MCHC: 34.1 g/dL (ref 32.0–36.0)
MCV: 82.7 fL (ref 80.0–100.0)
MPV: 12.1 fL (ref 7.5–12.5)
Monocytes Relative: 7.5 %
NEUTROS PCT: 40.9 %
Neutro Abs: 2495 cells/uL (ref 1500–7800)
PLATELETS: 248 10*3/uL (ref 140–400)
RBC: 5.1 10*6/uL (ref 4.20–5.80)
RDW: 15.3 % — AB (ref 11.0–15.0)
TOTAL LYMPHOCYTE: 47.5 %
WBC mixed population: 458 cells/uL (ref 200–950)
WBC: 6.1 10*3/uL (ref 3.8–10.8)

## 2018-05-31 LAB — COMPLETE METABOLIC PANEL WITH GFR
AG RATIO: 1.5 (calc) (ref 1.0–2.5)
ALKALINE PHOSPHATASE (APISO): 75 U/L (ref 40–115)
ALT: 16 U/L (ref 9–46)
AST: 23 U/L (ref 10–40)
Albumin: 4.1 g/dL (ref 3.6–5.1)
BILIRUBIN TOTAL: 0.5 mg/dL (ref 0.2–1.2)
BUN/Creatinine Ratio: 11 (calc) (ref 6–22)
BUN: 18 mg/dL (ref 7–25)
CHLORIDE: 105 mmol/L (ref 98–110)
CO2: 27 mmol/L (ref 20–32)
Calcium: 9.2 mg/dL (ref 8.6–10.3)
Creat: 1.58 mg/dL — ABNORMAL HIGH (ref 0.60–1.35)
GFR, Est African American: 62 mL/min/{1.73_m2} (ref >=60)
GFR, Est Non African American: 54 mL/min/{1.73_m2} — ABNORMAL LOW (ref >=60)
GLUCOSE: 100 mg/dL — AB (ref 65–99)
Globulin: 2.8 g/dL (calc) (ref 1.9–3.7)
POTASSIUM: 4.4 mmol/L (ref 3.5–5.3)
Sodium: 139 mmol/L (ref 135–146)
Total Protein: 6.9 g/dL (ref 6.1–8.1)

## 2018-05-31 LAB — RPR: RPR Ser Ql: NONREACTIVE

## 2018-05-31 LAB — LIPID PANEL
CHOLESTEROL: 177 mg/dL (ref <200)
HDL: 34 mg/dL — ABNORMAL LOW (ref >40)
LDL CHOLESTEROL (CALC): 117 mg/dL — AB
Non-HDL Cholesterol (Calc): 143 mg/dL (calc) — ABNORMAL HIGH (ref <130)
TRIGLYCERIDES: 143 mg/dL (ref <150)
Total CHOL/HDL Ratio: 5.2 (calc) — ABNORMAL HIGH (ref <5.0)

## 2018-05-31 LAB — T-HELPER CELL (CD4) - (RCID CLINIC ONLY)
CD4 T CELL ABS: 850 /uL (ref 400–2700)
CD4 T CELL HELPER: 27 % — AB (ref 33–55)

## 2018-06-01 LAB — HIV-1 RNA QUANT-NO REFLEX-BLD
HIV 1 RNA QUANT: 98 {copies}/mL — AB
HIV-1 RNA Quant, Log: 1.99 Log copies/mL — ABNORMAL HIGH

## 2018-06-13 ENCOUNTER — Ambulatory Visit (INDEPENDENT_AMBULATORY_CARE_PROVIDER_SITE_OTHER): Payer: Self-pay | Admitting: Internal Medicine

## 2018-06-13 ENCOUNTER — Encounter: Payer: Self-pay | Admitting: Internal Medicine

## 2018-06-13 VITALS — BP 148/87 | HR 66 | Temp 97.8°F | Ht 73.0 in | Wt 260.0 lb

## 2018-06-13 DIAGNOSIS — N289 Disorder of kidney and ureter, unspecified: Secondary | ICD-10-CM

## 2018-06-13 DIAGNOSIS — B20 Human immunodeficiency virus [HIV] disease: Secondary | ICD-10-CM

## 2018-06-13 DIAGNOSIS — F1910 Other psychoactive substance abuse, uncomplicated: Secondary | ICD-10-CM

## 2018-06-13 NOTE — Progress Notes (Signed)
   Subjective:    Patient ID: Jose Carter, male    DOB: [birth year], 42 y.o.   MRN: [id number]  HPI Here for follow up of HIV He has been on Genvoya and has had intermittent compliance.  He restarted about 3-4 weeks ago after being off medication due to drug use.  He had at the time found he was missing days each week on the medication.  He though now comes in with a determination to be drug free.  He has been off of drugs over 1 month and attending a day program and counseling.  He wants to be off of drugs and alcohol, which previously he did not.  He is also back in the gym, exercising.   No associated rash, diarrhea.    Review of Systems  Constitutional: Negative for fatigue and unexpected weight change.  Gastrointestinal: Negative for nausea.  Skin: Negative for rash.  Neurological: Negative for dizziness.       Objective:   Physical Exam  Constitutional: He appears well-developed and well-nourished. No distress.  HENT:  Mouth/Throat: No oropharyngeal exudate.  Eyes: No scleral icterus.  Cardiovascular: Normal rate, regular rhythm and normal heart sounds.  No murmur heard. Pulmonary/Chest: Effort normal and breath sounds normal. No respiratory distress.  Lymphadenopathy:    He has no cervical adenopathy.  Skin: No rash noted.   SH: drug free       Assessment & Plan:

## 2018-06-13 NOTE — Assessment & Plan Note (Signed)
Chronic and will continue to monitor.

## 2018-06-13 NOTE — Assessment & Plan Note (Signed)
Encouraged continued compliance off of drugs.  Congratulated him on wanting to stop.

## 2018-06-13 NOTE — Assessment & Plan Note (Signed)
Concerned for resistance.  His viral load though is nearly suppressed back on treatment.  Will recheck in 2 months.

## 2018-06-27 ENCOUNTER — Ambulatory Visit: Payer: Self-pay

## 2018-08-15 ENCOUNTER — Other Ambulatory Visit: Payer: Self-pay | Admitting: Internal Medicine

## 2018-08-15 ENCOUNTER — Other Ambulatory Visit: Payer: Self-pay

## 2018-08-15 DIAGNOSIS — B20 Human immunodeficiency virus [HIV] disease: Secondary | ICD-10-CM

## 2018-08-16 ENCOUNTER — Encounter: Payer: Self-pay | Admitting: Internal Medicine

## 2018-08-16 LAB — T-HELPER CELL (CD4) - (RCID CLINIC ONLY)
CD4 T CELL ABS: 530 /uL (ref 400–2700)
CD4 T CELL HELPER: 26 % — AB (ref 33–55)

## 2018-08-29 ENCOUNTER — Encounter: Payer: Self-pay | Admitting: Internal Medicine

## 2018-08-29 LAB — BASIC METABOLIC PANEL
BUN / CREAT RATIO: 11 (calc) (ref 6–22)
BUN: 16 mg/dL (ref 7–25)
CHLORIDE: 106 mmol/L (ref 98–110)
CO2: 27 mmol/L (ref 20–32)
Calcium: 9 mg/dL (ref 8.6–10.3)
Creat: 1.47 mg/dL — ABNORMAL HIGH (ref 0.60–1.35)
GLUCOSE: 110 mg/dL — AB (ref 65–99)
Potassium: 4 mmol/L (ref 3.5–5.3)
Sodium: 141 mmol/L (ref 135–146)

## 2018-08-29 LAB — HIV-1 GENOTYPING (RTI,PI,IN INHBTR): HIV-1 GENOTYPE: DETECTED — AB

## 2018-09-20 ENCOUNTER — Other Ambulatory Visit: Payer: Self-pay | Admitting: Internal Medicine

## 2018-09-21 ENCOUNTER — Telehealth: Payer: Self-pay

## 2018-09-21 NOTE — Telephone Encounter (Addendum)
Called patient in regards to medication refill. Patient stated this matter was resolved and he picked up his medication yesterday 09/20/18. Reminded patient of his upcoming appointment and patient verified he would be able to make it to his appointment.  Alfie Rideaux S. LPN ----- Message from Johaura Celedonio sent at 09/20/2018  5:31 PM EDT ----- Contact: [phone] Patient called asking if we received prescription request refill for Genvoya from the pharmacy

## 2018-09-22 ENCOUNTER — Other Ambulatory Visit: Payer: Self-pay | Admitting: *Deleted

## 2018-09-22 ENCOUNTER — Other Ambulatory Visit: Payer: Self-pay

## 2018-09-22 ENCOUNTER — Ambulatory Visit (INDEPENDENT_AMBULATORY_CARE_PROVIDER_SITE_OTHER): Payer: Self-pay | Admitting: *Deleted

## 2018-09-22 ENCOUNTER — Telehealth: Payer: Self-pay | Admitting: *Deleted

## 2018-09-22 DIAGNOSIS — B20 Human immunodeficiency virus [HIV] disease: Secondary | ICD-10-CM

## 2018-09-22 DIAGNOSIS — Z113 Encounter for screening for infections with a predominantly sexual mode of transmission: Secondary | ICD-10-CM

## 2018-09-22 DIAGNOSIS — A64 Unspecified sexually transmitted disease: Secondary | ICD-10-CM

## 2018-09-22 MED ORDER — AZITHROMYCIN 250 MG PO TABS: 250.0000 mg | ORAL_TABLET | Freq: Once | ORAL | Status: DC

## 2018-09-22 MED ORDER — CEFTRIAXONE SODIUM 250 MG IJ SOLR: 1000.0000 mg | Freq: Once | INTRAMUSCULAR | Status: DC

## 2018-09-22 MED ORDER — CEFTRIAXONE SODIUM 250 MG IJ SOLR
250.0000 mg | Freq: Once | INTRAMUSCULAR | Status: AC
  Administered 2018-09-22: 250 mg via INTRAMUSCULAR

## 2018-09-22 MED ORDER — AZITHROMYCIN 250 MG PO TABS
1000.0000 mg | ORAL_TABLET | Freq: Once | ORAL | Status: AC
  Administered 2018-09-22: 1000 mg via ORAL

## 2018-09-22 NOTE — Telephone Encounter (Signed)
Patient called today from an urgent care to advise they want him seen and tested today here. Spoke with RN who states the provider there thinks the patient has Gonorrhea and needs testing (swabs) they can not do there. She advised the patient has swollen hands and feet and rash in his groin area. Advised patient we can get the labs today and check with to provider to see if we can treat. Patient also states he had an Evisit and was told it was probably gonorrhea as well. Advised to send him to the office for testing and treatment.  Spoke with Dr Luciana Axe and advised of the patient symptoms and was advised to treat with 1 gram azithromycin and 250 Rocephin and do routine labs.  Patient scheduled for follow up visit with [name] 09/28/18. Patient aware and will be at visit.

## 2018-09-23 LAB — T-HELPER CELL (CD4) - (RCID CLINIC ONLY)
CD4 T CELL HELPER: 28 % — AB (ref 33–55)
CD4 T Cell Abs: 660 /uL (ref 400–2700)

## 2018-09-23 LAB — URINE CYTOLOGY ANCILLARY ONLY
CHLAMYDIA, DNA PROBE: NEGATIVE
NEISSERIA GONORRHEA: NEGATIVE

## 2018-09-23 LAB — CYTOLOGY, (ORAL, ANAL, URETHRAL) ANCILLARY ONLY
Chlamydia: NEGATIVE
Chlamydia: POSITIVE — AB
NEISSERIA GONORRHEA: POSITIVE — AB
Neisseria Gonorrhea: NEGATIVE

## 2018-09-24 LAB — COMPLETE METABOLIC PANEL WITH GFR
AG Ratio: 1.2 (calc) (ref 1.0–2.5)
ALKALINE PHOSPHATASE (APISO): 71 U/L (ref 40–115)
ALT: 11 U/L (ref 9–46)
AST: 15 U/L (ref 10–40)
Albumin: 4 g/dL (ref 3.6–5.1)
BILIRUBIN TOTAL: 0.3 mg/dL (ref 0.2–1.2)
BUN / CREAT RATIO: 9 (calc) (ref 6–22)
BUN: 15 mg/dL (ref 7–25)
CHLORIDE: 103 mmol/L (ref 98–110)
CO2: 23 mmol/L (ref 20–32)
Calcium: 8.9 mg/dL (ref 8.6–10.3)
Creat: 1.58 mg/dL — ABNORMAL HIGH (ref 0.60–1.35)
GFR, Est African American: 62 mL/min/{1.73_m2} (ref >=60)
GFR, Est Non African American: 54 mL/min/{1.73_m2} — ABNORMAL LOW (ref >=60)
GLUCOSE: 93 mg/dL (ref 65–99)
Globulin: 3.4 g/dL (calc) (ref 1.9–3.7)
POTASSIUM: 4.4 mmol/L (ref 3.5–5.3)
Sodium: 140 mmol/L (ref 135–146)
Total Protein: 7.4 g/dL (ref 6.1–8.1)

## 2018-09-24 LAB — CBC WITH DIFFERENTIAL/PLATELET
BASOS PCT: 0.3 %
Basophils Absolute: 23 cells/uL (ref 0–200)
Eosinophils Absolute: 231 cells/uL (ref 15–500)
Eosinophils Relative: 3 %
HEMATOCRIT: 40.8 % (ref 38.5–50.0)
Hemoglobin: 13.5 g/dL (ref 13.2–17.1)
LYMPHS ABS: 2041 {cells}/uL (ref 850–3900)
MCH: 28.1 pg (ref 27.0–33.0)
MCHC: 33.1 g/dL (ref 32.0–36.0)
MCV: 84.8 fL (ref 80.0–100.0)
MPV: 12.1 fL (ref 7.5–12.5)
Monocytes Relative: 7.7 %
Neutro Abs: 4813 cells/uL (ref 1500–7800)
Neutrophils Relative %: 62.5 %
PLATELETS: 310 10*3/uL (ref 140–400)
RBC: 4.81 10*6/uL (ref 4.20–5.80)
RDW: 14.2 % (ref 11.0–15.0)
TOTAL LYMPHOCYTE: 26.5 %
WBC: 7.7 10*3/uL (ref 3.8–10.8)
WBCMIX: 593 {cells}/uL (ref 200–950)

## 2018-09-24 LAB — RPR: RPR Ser Ql: NONREACTIVE

## 2018-09-24 LAB — HIV-1 RNA QUANT-NO REFLEX-BLD
HIV 1 RNA Quant: <20 copies/mL
HIV-1 RNA QUANT, LOG: NOT DETECTED {Log_copies}/mL

## 2018-09-28 ENCOUNTER — Ambulatory Visit: Payer: Self-pay | Admitting: Internal Medicine

## 2018-10-17 ENCOUNTER — Other Ambulatory Visit: Payer: Self-pay | Admitting: Internal Medicine

## 2018-12-28 ENCOUNTER — Ambulatory Visit: Payer: Self-pay | Admitting: Internal Medicine

## 2019-03-15 ENCOUNTER — Other Ambulatory Visit: Payer: Self-pay | Admitting: Internal Medicine

## 2019-05-02 ENCOUNTER — Other Ambulatory Visit: Payer: Self-pay | Admitting: Internal Medicine

## 2019-05-10 ENCOUNTER — Telehealth: Payer: Self-pay | Admitting: *Deleted

## 2019-05-10 NOTE — Telephone Encounter (Signed)
Received call from [facility] center, needs to confirm patient is seen here, will send signed release to get last office note, labs, and medication list. Andree Coss, RN

## 2019-05-29 ENCOUNTER — Other Ambulatory Visit: Payer: Self-pay | Admitting: Internal Medicine
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
## 2020-04-17 ENCOUNTER — Other Ambulatory Visit: Payer: Self-pay | Admitting: *Deleted

## 2020-04-17 ENCOUNTER — Other Ambulatory Visit: Payer: Self-pay

## 2020-04-17 ENCOUNTER — Ambulatory Visit: Payer: Self-pay

## 2020-04-17 ENCOUNTER — Encounter: Payer: Self-pay | Admitting: Internal Medicine

## 2020-04-17 DIAGNOSIS — Z113 Encounter for screening for infections with a predominantly sexual mode of transmission: Secondary | ICD-10-CM

## 2020-04-17 DIAGNOSIS — Z79899 Other long term (current) drug therapy: Secondary | ICD-10-CM

## 2020-04-17 DIAGNOSIS — B20 Human immunodeficiency virus [HIV] disease: Secondary | ICD-10-CM

## 2020-04-17 DIAGNOSIS — A64 Unspecified sexually transmitted disease: Secondary | ICD-10-CM

## 2020-04-18 LAB — T-HELPER CELL (CD4) - (RCID CLINIC ONLY)
CD4 % Helper T Cell: 31 % — ABNORMAL LOW (ref 33–65)
CD4 T Cell Abs: 711 /uL (ref 400–1790)

## 2020-04-18 LAB — URINE CYTOLOGY ANCILLARY ONLY
Chlamydia: NEGATIVE
Comment: NEGATIVE
Comment: NORMAL
Neisseria Gonorrhea: NEGATIVE

## 2020-04-19 LAB — CBC WITH DIFFERENTIAL/PLATELET
Absolute Monocytes: 589 cells/uL (ref 200–950)
Basophils Absolute: 38 cells/uL (ref 0–200)
Basophils Relative: 0.6 %
Eosinophils Absolute: 288 cells/uL (ref 15–500)
Eosinophils Relative: 4.5 %
HCT: 42 % (ref 38.5–50.0)
Hemoglobin: 13.9 g/dL (ref 13.2–17.1)
Lymphs Abs: 2304 cells/uL (ref 850–3900)
MCH: 28 pg (ref 27.0–33.0)
MCHC: 33.1 g/dL (ref 32.0–36.0)
MCV: 84.7 fL (ref 80.0–100.0)
MPV: 12.5 fL (ref 7.5–12.5)
Monocytes Relative: 9.2 %
Neutro Abs: 3181 cells/uL (ref 1500–7800)
Neutrophils Relative %: 49.7 %
Platelets: 231 10*3/uL (ref 140–400)
RBC: 4.96 10*6/uL (ref 4.20–5.80)
RDW: 14 % (ref 11.0–15.0)
Total Lymphocyte: 36 %
WBC: 6.4 10*3/uL (ref 3.8–10.8)

## 2020-04-19 LAB — COMPLETE METABOLIC PANEL WITH GFR
AG Ratio: 1.5 (calc) (ref 1.0–2.5)
ALT: 15 U/L (ref 9–46)
AST: 16 U/L (ref 10–40)
Albumin: 4.1 g/dL (ref 3.6–5.1)
Alkaline phosphatase (APISO): 75 U/L (ref 36–130)
BUN/Creatinine Ratio: 8 (calc) (ref 6–22)
BUN: 13 mg/dL (ref 7–25)
CO2: 29 mmol/L (ref 20–32)
Calcium: 9.4 mg/dL (ref 8.6–10.3)
Chloride: 104 mmol/L (ref 98–110)
Creat: 1.59 mg/dL — ABNORMAL HIGH (ref 0.60–1.35)
GFR, Est African American: 61 mL/min/{1.73_m2} (ref >=60)
GFR, Est Non African American: 53 mL/min/{1.73_m2} — ABNORMAL LOW (ref >=60)
Globulin: 2.7 g/dL (calc) (ref 1.9–3.7)
Glucose, Bld: 94 mg/dL (ref 65–99)
Potassium: 4.5 mmol/L (ref 3.5–5.3)
Sodium: 138 mmol/L (ref 135–146)
Total Bilirubin: 0.4 mg/dL (ref 0.2–1.2)
Total Protein: 6.8 g/dL (ref 6.1–8.1)

## 2020-04-19 LAB — LIPID PANEL
Cholesterol: 169 mg/dL (ref <200)
HDL: 38 mg/dL — ABNORMAL LOW (ref >=40)
LDL Cholesterol (Calc): 105 mg/dL (calc) — ABNORMAL HIGH
Non-HDL Cholesterol (Calc): 131 mg/dL (calc) — ABNORMAL HIGH (ref <130)
Total CHOL/HDL Ratio: 4.4 (calc) (ref <5.0)
Triglycerides: 148 mg/dL (ref <150)

## 2020-04-19 LAB — HIV-1 RNA QUANT-NO REFLEX-BLD
HIV 1 RNA Quant: <20 copies/mL — AB
HIV-1 RNA Quant, Log: <1.3 Log copies/mL — AB

## 2020-04-19 LAB — RPR: RPR Ser Ql: NONREACTIVE

## 2020-05-06 ENCOUNTER — Encounter: Payer: PRIVATE HEALTH INSURANCE | Admitting: Internal Medicine

## 2020-05-13 ENCOUNTER — Other Ambulatory Visit: Payer: Self-pay

## 2020-05-13 ENCOUNTER — Ambulatory Visit (INDEPENDENT_AMBULATORY_CARE_PROVIDER_SITE_OTHER): Payer: Self-pay | Admitting: Internal Medicine

## 2020-05-13 ENCOUNTER — Encounter: Payer: Self-pay | Admitting: Internal Medicine

## 2020-05-13 VITALS — BP 145/100 | HR 98 | Temp 97.9°F | Ht 73.0 in | Wt 277.0 lb

## 2020-05-13 DIAGNOSIS — Z7185 Encounter for immunization safety counseling: Secondary | ICD-10-CM

## 2020-05-13 DIAGNOSIS — F1910 Other psychoactive substance abuse, uncomplicated: Secondary | ICD-10-CM

## 2020-05-13 DIAGNOSIS — Z720 Tobacco use: Secondary | ICD-10-CM

## 2020-05-13 DIAGNOSIS — Z113 Encounter for screening for infections with a predominantly sexual mode of transmission: Secondary | ICD-10-CM

## 2020-05-13 DIAGNOSIS — Z7189 Other specified counseling: Secondary | ICD-10-CM

## 2020-05-13 DIAGNOSIS — B20 Human immunodeficiency virus [HIV] disease: Secondary | ICD-10-CM

## 2020-05-13 DIAGNOSIS — R5383 Other fatigue: Secondary | ICD-10-CM

## 2020-05-14 ENCOUNTER — Encounter: Payer: Self-pay | Admitting: Internal Medicine
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
## 2020-05-14 NOTE — Assessment & Plan Note (Signed)
Has has completed the COVID vaccine

## 2020-05-14 NOTE — Assessment & Plan Note (Signed)
currenlty drug free and will help get him in to our counselor to help with his anxiety management

## 2020-05-14 NOTE — Assessment & Plan Note (Signed)
Discussed cessation 

## 2020-05-14 NOTE — Assessment & Plan Note (Signed)
Screened negative 

## 2020-05-14 NOTE — Assessment & Plan Note (Signed)
Doing well on Genvoya and no changes.   rtc for this in 6 months

## 2020-05-14 NOTE — Assessment & Plan Note (Signed)
Will check an echo with his history of COVID and easy fatigability.

## 2020-05-15 ENCOUNTER — Ambulatory Visit (HOSPITAL_COMMUNITY): Admission: RE | Admit: 2020-05-15 | Payer: Self-pay | Source: Ambulatory Visit

## 2020-06-12 ENCOUNTER — Ambulatory Visit: Payer: Self-pay | Admitting: Internal Medicine

## 2020-10-15 ENCOUNTER — Telehealth: Payer: Self-pay

## 2020-10-15 MED ORDER — GENVOYA 150-150-200-10 MG PO TABS: 1.0000 | ORAL_TABLET | Freq: Every day | ORAL | 0 refills | Status: DC
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
## 2020-10-16 ENCOUNTER — Telehealth: Payer: Self-pay

## 2020-10-16 ENCOUNTER — Other Ambulatory Visit: Payer: Self-pay | Admitting: *Deleted

## 2020-10-16 ENCOUNTER — Other Ambulatory Visit: Payer: Self-pay

## 2020-10-16 ENCOUNTER — Ambulatory Visit: Payer: Self-pay

## 2020-10-16 DIAGNOSIS — B20 Human immunodeficiency virus [HIV] disease: Secondary | ICD-10-CM

## 2020-10-16 NOTE — Telephone Encounter (Addendum)
[facility] Patient Advocate Encounter  Completed and sent [employer] Advancing Access application for Genviya for this patient who is uninsured.    Patient is approved 10/27/2 through 10/16/21.     Clearance Coots, CPhT [facility] Phone: [phone] Fax:  [phone]

## 2020-10-17 ENCOUNTER — Encounter: Payer: Self-pay | Admitting: Internal Medicine

## 2020-10-17 LAB — T-HELPER CELL (CD4) - (RCID CLINIC ONLY)
CD4 % Helper T Cell: 26 % — ABNORMAL LOW (ref 33–65)
CD4 T Cell Abs: 437 /uL (ref 400–1790)

## 2020-10-21 LAB — CBC WITH DIFFERENTIAL/PLATELET
Absolute Monocytes: 320 cells/uL (ref 200–950)
Basophils Absolute: 20 cells/uL (ref 0–200)
Basophils Relative: 0.4 %
Eosinophils Absolute: 150 cells/uL (ref 15–500)
Eosinophils Relative: 3 %
HCT: 46 % (ref 38.5–50.0)
Hemoglobin: 14.7 g/dL (ref 13.2–17.1)
Lymphs Abs: 1940 cells/uL (ref 850–3900)
MCH: 27.7 pg (ref 27.0–33.0)
MCHC: 32 g/dL (ref 32.0–36.0)
MCV: 86.6 fL (ref 80.0–100.0)
MPV: 12.6 fL — ABNORMAL HIGH (ref 7.5–12.5)
Monocytes Relative: 6.4 %
Neutro Abs: 2570 cells/uL (ref 1500–7800)
Neutrophils Relative %: 51.4 %
Platelets: 235 10*3/uL (ref 140–400)
RBC: 5.31 10*6/uL (ref 4.20–5.80)
RDW: 15.2 % — ABNORMAL HIGH (ref 11.0–15.0)
Total Lymphocyte: 38.8 %
WBC: 5 10*3/uL (ref 3.8–10.8)

## 2020-10-21 LAB — COMPLETE METABOLIC PANEL WITH GFR
AG Ratio: 1.6 (calc) (ref 1.0–2.5)
ALT: 18 U/L (ref 9–46)
AST: 22 U/L (ref 10–40)
Albumin: 4.4 g/dL (ref 3.6–5.1)
Alkaline phosphatase (APISO): 55 U/L (ref 36–130)
BUN/Creatinine Ratio: 13 (calc) (ref 6–22)
BUN: 22 mg/dL (ref 7–25)
CO2: 23 mmol/L (ref 20–32)
Calcium: 9.5 mg/dL (ref 8.6–10.3)
Chloride: 109 mmol/L (ref 98–110)
Creat: 1.68 mg/dL — ABNORMAL HIGH (ref 0.60–1.35)
GFR, Est African American: 57 mL/min/{1.73_m2} — ABNORMAL LOW (ref >=60)
GFR, Est Non African American: 49 mL/min/{1.73_m2} — ABNORMAL LOW (ref >=60)
Globulin: 2.8 g/dL (calc) (ref 1.9–3.7)
Glucose, Bld: 91 mg/dL (ref 65–99)
Potassium: 4.2 mmol/L (ref 3.5–5.3)
Sodium: 140 mmol/L (ref 135–146)
Total Bilirubin: 0.5 mg/dL (ref 0.2–1.2)
Total Protein: 7.2 g/dL (ref 6.1–8.1)

## 2020-10-21 LAB — HIV-1 RNA QUANT-NO REFLEX-BLD
HIV 1 RNA Quant: <20 Copies/mL
HIV-1 RNA Quant, Log: <1.3 Log cps/mL

## 2020-10-28 ENCOUNTER — Ambulatory Visit (INDEPENDENT_AMBULATORY_CARE_PROVIDER_SITE_OTHER): Payer: Self-pay | Admitting: Internal Medicine

## 2020-10-28 ENCOUNTER — Encounter: Payer: Self-pay | Admitting: Internal Medicine

## 2020-10-28 ENCOUNTER — Telehealth: Payer: Self-pay | Admitting: *Deleted

## 2020-10-28 ENCOUNTER — Other Ambulatory Visit: Payer: Self-pay

## 2020-10-28 VITALS — BP 138/93 | HR 76 | Temp 97.5°F | Ht 73.0 in | Wt 268.0 lb

## 2020-10-28 DIAGNOSIS — Z23 Encounter for immunization: Secondary | ICD-10-CM

## 2020-10-28 DIAGNOSIS — B20 Human immunodeficiency virus [HIV] disease: Secondary | ICD-10-CM

## 2020-10-28 DIAGNOSIS — R3589 Other polyuria: Secondary | ICD-10-CM

## 2020-10-28 DIAGNOSIS — Z113 Encounter for screening for infections with a predominantly sexual mode of transmission: Secondary | ICD-10-CM

## 2020-10-28 DIAGNOSIS — Z720 Tobacco use: Secondary | ICD-10-CM

## 2020-10-28 DIAGNOSIS — R350 Frequency of micturition: Secondary | ICD-10-CM

## 2020-10-28 NOTE — Assessment & Plan Note (Addendum)
He is doing very well, particularly since he became clean and sober.  He has not been missing any doses in the last 2 months and labs reassuring.   No changes and rtc in 6 months.  Will get him into a PCP

## 2020-10-28 NOTE — Progress Notes (Signed)
   Subjective:    Patient ID: HOOPER PETTEWAY, male    DOB: 1976-06-10, 44 y.o.   MRN: [id number]  HPI Here for follow up of HIV He continues on Genvoya with no missed doses. CD4 437 and viral load < 20.  Creat remains mildly elevated but stable.  No new complaints.   He has remained drug and alcohol free now for 56 days!  He feels much better.  Has started going to church. Exercising.  Attending virtual AA and NA meetings.  Looking at becoming a resource/advocate for HIV.   Some concern with polyuria.     Review of Systems  Respiratory: Negative for shortness of breath.   Gastrointestinal: Negative for diarrhea.  Neurological: Negative for headaches.       Objective:   Physical Exam Constitutional:      Appearance: Normal appearance.  Eyes:     General: No scleral icterus. Cardiovascular:     Rate and Rhythm: Normal rate.  Pulmonary:     Effort: Pulmonary effort is normal.  Skin:    Findings: No rash.  Neurological:     General: No focal deficit present.     Mental Status: He is alert.  Psychiatric:        Mood and Affect: Mood normal.    SH: + tobacco.  No current drug use now for 56 days       Assessment & Plan:

## 2020-10-28 NOTE — Telephone Encounter (Signed)
error 

## 2020-10-28 NOTE — Assessment & Plan Note (Signed)
This is new and correlates with excessive caffeine intake (2 larger [employer] daily) and now has improved since stopping.  Sugar wnl.   No other concerns.  He will monitor if it continues to improve.

## 2020-10-28 NOTE — Assessment & Plan Note (Signed)
Discussed cessation and he is contemplative.  Trying gum.

## 2020-12-18 ENCOUNTER — Other Ambulatory Visit: Payer: Self-pay

## 2020-12-18 MED ORDER — GENVOYA 150-150-200-10 MG PO TABS: 1.0000 | ORAL_TABLET | Freq: Every day | ORAL | 5 refills | Status: DC

## 2020-12-27 ENCOUNTER — Ambulatory Visit (INDEPENDENT_AMBULATORY_CARE_PROVIDER_SITE_OTHER): Payer: Self-pay

## 2020-12-27 ENCOUNTER — Other Ambulatory Visit: Payer: Self-pay

## 2020-12-27 DIAGNOSIS — Z23 Encounter for immunization: Secondary | ICD-10-CM

## 2021-01-02 ENCOUNTER — Other Ambulatory Visit: Payer: Self-pay

## 2021-01-02 ENCOUNTER — Ambulatory Visit: Payer: Self-pay

## 2021-01-31 ENCOUNTER — Other Ambulatory Visit: Payer: Self-pay | Admitting: Pharmacist

## 2021-02-11 ENCOUNTER — Encounter: Payer: Self-pay | Admitting: Internal Medicine

## 2021-04-21 ENCOUNTER — Other Ambulatory Visit: Payer: Self-pay

## 2021-04-29 ENCOUNTER — Other Ambulatory Visit: Payer: Self-pay

## 2021-04-29 DIAGNOSIS — Z113 Encounter for screening for infections with a predominantly sexual mode of transmission: Secondary | ICD-10-CM

## 2021-04-29 DIAGNOSIS — B20 Human immunodeficiency virus [HIV] disease: Secondary | ICD-10-CM

## 2021-04-30 LAB — URINE CYTOLOGY ANCILLARY ONLY
Chlamydia: NEGATIVE
Comment: NEGATIVE
Comment: NORMAL
Neisseria Gonorrhea: NEGATIVE

## 2021-04-30 LAB — T-HELPER CELL (CD4) - (RCID CLINIC ONLY)
CD4 % Helper T Cell: 23 % — ABNORMAL LOW (ref 33–65)
CD4 T Cell Abs: 656 /uL (ref 400–1790)

## 2021-05-02 LAB — HIV-1 RNA QUANT-NO REFLEX-BLD
HIV 1 RNA Quant: <20 Copies/mL — ABNORMAL HIGH
HIV-1 RNA Quant, Log: <1.3 Log cps/mL — ABNORMAL HIGH

## 2021-05-02 LAB — RPR: RPR Ser Ql: NONREACTIVE

## 2021-05-06 ENCOUNTER — Encounter: Payer: Self-pay | Admitting: Internal Medicine

## 2021-05-09 ENCOUNTER — Encounter: Payer: Self-pay | Admitting: Internal Medicine

## 2021-06-13 ENCOUNTER — Encounter: Payer: Self-pay | Admitting: Internal Medicine

## 2021-06-13 ENCOUNTER — Other Ambulatory Visit: Payer: Self-pay

## 2021-06-13 ENCOUNTER — Ambulatory Visit (INDEPENDENT_AMBULATORY_CARE_PROVIDER_SITE_OTHER): Payer: Self-pay | Admitting: Internal Medicine

## 2021-06-13 DIAGNOSIS — H1010 Acute atopic conjunctivitis, unspecified eye: Secondary | ICD-10-CM

## 2021-06-13 DIAGNOSIS — B20 Human immunodeficiency virus [HIV] disease: Secondary | ICD-10-CM

## 2021-06-13 NOTE — Progress Notes (Signed)
   Subjective:    Patient ID: Jose Carter, male    DOB: 04/03/1976, 45 y.o.   MRN: [id number]  HPI I connected with  Jose Carter on 06/13/21 by phone and verified that I am speaking with the correct person using two identifiers.   I discussed the limitations of evaluation and management by telemedicine. The patient expressed understanding and agreed to proceed.  Location: Patient - home Physician - clinic  Duration of visit:  20 minutes  HPI: called for follow up of HIV He continues on Genvoya and denies any missed doses.  No new issues.  Working and busy.  Denies drug use other than marijuana.  No new complaints.  CD4 of 656 and viral load < 20.  No missed doses.     Review of Systems  Constitutional:  Negative for unexpected weight change.  Gastrointestinal:  Negative for diarrhea.  Skin:  Negative for rash.  Psychiatric/Behavioral:  Negative for dysphoric mood.       Objective:   Physical Exam Neurological:     Mental Status: He is alert.  Psychiatric:        Mood and Affect: Mood normal.          Assessment & Plan:

## 2021-06-13 NOTE — Assessment & Plan Note (Signed)
He continues to do well and remains clean of drugs.  Working and taking his medication well.  Labs reassurring.   Continue genvoya and rtc in 6 months.

## 2021-06-13 NOTE — Assessment & Plan Note (Signed)
He has significant symptoms with itchy eyes and not relieved by OTC medications.  He received some eye drops from an urgent care and will let me know of the name of the drops and I will refill them for him.  He will contact me by My Chart.

## 2021-07-28 ENCOUNTER — Telehealth: Payer: Self-pay

## 2021-07-28 NOTE — Telephone Encounter (Signed)
Patient called requesting monkeypox vaccine, advised him we do not currently have it in the office and advised he reach out to the health department. Patient verbalized understanding and has no further questions.   Sandie Ano, RN

## 2021-09-11 ENCOUNTER — Other Ambulatory Visit: Payer: Self-pay | Admitting: Internal Medicine

## 2021-09-12 ENCOUNTER — Other Ambulatory Visit: Payer: Self-pay

## 2021-09-12 MED ORDER — GENVOYA 150-150-200-10 MG PO TABS: 1.0000 | ORAL_TABLET | Freq: Every day | ORAL | 5 refills | Status: DC

## 2021-10-28 ENCOUNTER — Other Ambulatory Visit (HOSPITAL_COMMUNITY): Payer: Self-pay

## 2021-10-28 ENCOUNTER — Encounter: Payer: Self-pay | Admitting: Internal Medicine

## 2021-10-28 ENCOUNTER — Other Ambulatory Visit: Payer: Self-pay

## 2021-10-28 ENCOUNTER — Ambulatory Visit: Payer: Self-pay

## 2021-10-28 DIAGNOSIS — B20 Human immunodeficiency virus [HIV] disease: Secondary | ICD-10-CM

## 2021-10-28 DIAGNOSIS — Z79899 Other long term (current) drug therapy: Secondary | ICD-10-CM

## 2021-10-29 LAB — T-HELPER CELL (CD4) - (RCID CLINIC ONLY)
CD4 % Helper T Cell: 33 % (ref 33–65)
CD4 T Cell Abs: 789 /uL (ref 400–1790)

## 2021-10-30 LAB — CBC WITH DIFFERENTIAL/PLATELET
Absolute Monocytes: 441 cells/uL (ref 200–950)
Basophils Absolute: 32 cells/uL (ref 0–200)
Basophils Relative: 0.5 %
Eosinophils Absolute: 88 cells/uL (ref 15–500)
Eosinophils Relative: 1.4 %
HCT: 36 % — ABNORMAL LOW (ref 38.5–50.0)
Hemoglobin: 12 g/dL — ABNORMAL LOW (ref 13.2–17.1)
Lymphs Abs: 2696 cells/uL (ref 850–3900)
MCH: 28.2 pg (ref 27.0–33.0)
MCHC: 33.3 g/dL (ref 32.0–36.0)
MCV: 84.7 fL (ref 80.0–100.0)
MPV: 12.3 fL (ref 7.5–12.5)
Monocytes Relative: 7 %
Neutro Abs: 3043 cells/uL (ref 1500–7800)
Neutrophils Relative %: 48.3 %
Platelets: 201 10*3/uL (ref 140–400)
RBC: 4.25 10*6/uL (ref 4.20–5.80)
RDW: 14.1 % (ref 11.0–15.0)
Total Lymphocyte: 42.8 %
WBC: 6.3 10*3/uL (ref 3.8–10.8)

## 2021-10-30 LAB — HIV-1 RNA QUANT-NO REFLEX-BLD
HIV 1 RNA Quant: 25 Copies/mL — ABNORMAL HIGH
HIV-1 RNA Quant, Log: 1.39 Log cps/mL — ABNORMAL HIGH

## 2021-10-30 LAB — LIPID PANEL
Cholesterol: 147 mg/dL (ref <200)
HDL: 34 mg/dL — ABNORMAL LOW (ref >=40)
LDL Cholesterol (Calc): 88 mg/dL (calc)
Non-HDL Cholesterol (Calc): 113 mg/dL (calc) (ref <130)
Total CHOL/HDL Ratio: 4.3 (calc) (ref <5.0)
Triglycerides: 157 mg/dL — ABNORMAL HIGH (ref <150)

## 2021-10-30 LAB — COMPLETE METABOLIC PANEL WITH GFR
AG Ratio: 1.5 (calc) (ref 1.0–2.5)
ALT: 20 U/L (ref 9–46)
AST: 24 U/L (ref 10–40)
Albumin: 3.8 g/dL (ref 3.6–5.1)
Alkaline phosphatase (APISO): 52 U/L (ref 36–130)
BUN: 18 mg/dL (ref 7–25)
CO2: 23 mmol/L (ref 20–32)
Calcium: 8.8 mg/dL (ref 8.6–10.3)
Chloride: 111 mmol/L — ABNORMAL HIGH (ref 98–110)
Creat: 1.19 mg/dL (ref 0.60–1.29)
Globulin: 2.5 g/dL (calc) (ref 1.9–3.7)
Glucose, Bld: 111 mg/dL — ABNORMAL HIGH (ref 65–99)
Potassium: 4.1 mmol/L (ref 3.5–5.3)
Sodium: 141 mmol/L (ref 135–146)
Total Bilirubin: 0.3 mg/dL (ref 0.2–1.2)
Total Protein: 6.3 g/dL (ref 6.1–8.1)
eGFR: 78 mL/min/{1.73_m2} (ref >=60)

## 2021-12-01 ENCOUNTER — Encounter: Payer: Self-pay | Admitting: Internal Medicine

## 2021-12-01 ENCOUNTER — Ambulatory Visit (INDEPENDENT_AMBULATORY_CARE_PROVIDER_SITE_OTHER): Payer: Self-pay | Admitting: Internal Medicine

## 2021-12-01 ENCOUNTER — Other Ambulatory Visit: Payer: Self-pay

## 2021-12-01 ENCOUNTER — Ambulatory Visit (INDEPENDENT_AMBULATORY_CARE_PROVIDER_SITE_OTHER): Payer: Self-pay

## 2021-12-01 VITALS — BP 141/96 | HR 73 | Resp 16 | Ht 73.0 in | Wt 255.6 lb

## 2021-12-01 DIAGNOSIS — Z23 Encounter for immunization: Secondary | ICD-10-CM

## 2021-12-01 DIAGNOSIS — Z113 Encounter for screening for infections with a predominantly sexual mode of transmission: Secondary | ICD-10-CM

## 2021-12-01 DIAGNOSIS — B20 Human immunodeficiency virus [HIV] disease: Secondary | ICD-10-CM

## 2021-12-01 DIAGNOSIS — F1910 Other psychoactive substance abuse, uncomplicated: Secondary | ICD-10-CM

## 2021-12-01 DIAGNOSIS — H1010 Acute atopic conjunctivitis, unspecified eye: Secondary | ICD-10-CM

## 2021-12-01 MED ORDER — BICTEGRAVIR-EMTRICITAB-TENOFOV 50-200-25 MG PO TABS: 1.0000 | ORAL_TABLET | Freq: Every day | ORAL | 11 refills | Status: DC

## 2021-12-01 MED ORDER — GENVOYA 150-150-200-10 MG PO TABS: 1.0000 | ORAL_TABLET | Freq: Every day | ORAL | 5 refills | Status: DC

## 2021-12-01 NOTE — Progress Notes (Signed)
   Covid-19 Vaccination Clinic  Name:  Jose Carter    MRN: [id number] DOB: 1976-06-03  12/01/2021  [name] was observed post Covid-19 immunization for 15 minutes without incident. He was provided with Vaccine Information Sheet and instruction to access the V-Safe system.   [name] was instructed to call 911 with any severe reactions post vaccine: Difficulty breathing  Swelling of face and throat  A fast heartbeat  A bad rash all over body  Dizziness and weakness     Clayborne Artist CMA

## 2021-12-02 ENCOUNTER — Encounter: Payer: Self-pay | Admitting: Internal Medicine

## 2021-12-02 NOTE — Assessment & Plan Note (Signed)
He continues to do well though let his coverage lapse.  I reminded him to keep up with the coverage but labs are reassurring.  With the flonase though, will change him to [facility] to avoid the interaction with cobicistat.   Otherwise he can rtc in 6 months.

## 2021-12-02 NOTE — Progress Notes (Signed)
° °  Subjective:    Patient ID: Jose Carter, male    DOB: [birth year], 45 y.o.   MRN: [id number]  HPI Here for follow up of HIV He continues on Genvoya and had been out about 2 weeks when HMAP ran out but back on now.  CD4 of 789 and viral load just 25 copies.  He continues to have allergic rhinitis and now using flonase and allergy medication.  He quit smoking but is smoking marijuana more now.  He otherwise remains drug free.     Review of Systems  Constitutional:  Negative for fatigue.  Gastrointestinal:  Negative for diarrhea and nausea.  Skin:  Negative for rash.      Objective:   Physical Exam Eyes:     General: No scleral icterus. Cardiovascular:     Rate and Rhythm: Normal rate and regular rhythm.  Pulmonary:     Effort: Pulmonary effort is normal.  Skin:    Findings: No rash.  Neurological:     General: No focal deficit present.     Mental Status: He is alert.  Psychiatric:        Mood and Affect: Mood normal.   SH: no current tobacco, + smoking marijuana       Assessment & Plan:

## 2021-12-02 NOTE — Assessment & Plan Note (Signed)
Screened negative 
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
## 2021-12-02 NOTE — Assessment & Plan Note (Signed)
He continues to remain drug free

## 2022-01-05 ENCOUNTER — Ambulatory Visit: Payer: Self-pay

## 2022-01-19 ENCOUNTER — Other Ambulatory Visit: Payer: Self-pay

## 2022-01-19 ENCOUNTER — Ambulatory Visit: Payer: Self-pay

## 2022-01-29 ENCOUNTER — Other Ambulatory Visit: Payer: Self-pay

## 2022-01-29 ENCOUNTER — Emergency Department (HOSPITAL_BASED_OUTPATIENT_CLINIC_OR_DEPARTMENT_OTHER)
Admission: EM | Admit: 2022-01-29 | Discharge: 2022-01-29 | Disposition: A | Discharge status: Home / Self Care | Payer: Self-pay | Attending: Emergency Medicine | Admitting: Emergency Medicine

## 2022-01-29 ENCOUNTER — Encounter (HOSPITAL_BASED_OUTPATIENT_CLINIC_OR_DEPARTMENT_OTHER): Payer: Self-pay

## 2022-01-29 DIAGNOSIS — Z20822 Contact with and (suspected) exposure to covid-19: Secondary | ICD-10-CM | POA: Insufficient documentation

## 2022-01-29 DIAGNOSIS — J101 Influenza due to other identified influenza virus with other respiratory manifestations: Secondary | ICD-10-CM | POA: Insufficient documentation

## 2022-01-29 DIAGNOSIS — Z21 Asymptomatic human immunodeficiency virus [HIV] infection status: Secondary | ICD-10-CM | POA: Insufficient documentation

## 2022-01-29 LAB — RESP PANEL BY RT-PCR (FLU A&B, COVID) ARPGX2
Influenza A by PCR: POSITIVE — AB
Influenza B by PCR: NEGATIVE
SARS Coronavirus 2 by RT PCR: NEGATIVE

## 2022-01-29 MED ORDER — BENZONATATE 100 MG PO CAPS: 100.0000 mg | ORAL_CAPSULE | Freq: Three times a day (TID) | ORAL | 0 refills | Status: DC

## 2022-01-29 MED ORDER — ACETAMINOPHEN 500 MG PO TABS: 500.0000 mg | ORAL_TABLET | Freq: Four times a day (QID) | ORAL | 0 refills | Status: DC | PRN

## 2022-01-29 MED ORDER — OSELTAMIVIR PHOSPHATE 75 MG PO CAPS: 75.0000 mg | ORAL_CAPSULE | Freq: Two times a day (BID) | ORAL | 0 refills | Status: DC

## 2022-01-29 NOTE — ED Provider Notes (Signed)
[facility] EMERGENCY DEPT Provider Note   CSN: [id number] Arrival date & time: 01/29/22  1246     History  Chief Complaint  Patient presents with   Generalized Body Aches    Jose Carter is a 46 y.o. male.  The history is provided by the patient and medical records. No language interpreter was used.   47 year old male with significant history of HIV, polysubstance abuse presenting with cold symptoms.  Patient reports since yesterday he developed body aches, sore throat, coughing, congestion, and generalized fatigue.  He tried taking over-the-counter medication without relief.  He had a negative COVID test yesterday.  He has been compliant with his HIV medication.  He denies severe headache, neck stiffness, shortness of breath, abdominal pain, nausea vomiting or diarrhea or rash.  Home Medications Prior to Admission medications   Medication Sig Start Date End Date Taking? Authorizing Provider  bictegravir-emtricitabine-tenofovir AF (BIKTARVY) 50-200-25 MG TABS tablet Take 1 tablet by mouth daily. 12/01/21   Gardiner Barefoot, MD  fexofenadine [facility] ALLERGY) 60 MG tablet Take 60 mg by mouth 2 (two) times daily.    [provider]  fluticasone (FLONASE) 50 MCG/ACT nasal spray Place into both nostrils daily.    [provider]  triamcinolone (KENALOG) 0.025 % ointment Apply 1 application topically 2 (two) times daily. As needed for rash Patient not taking: Reported on 06/13/2018 11/22/14   Gardiner Barefoot, MD      Allergies    Sustiva [efavirenz]    Review of Systems   Review of Systems  All other systems reviewed and are negative.  Physical Exam Updated Vital Signs BP (!) 144/103    Pulse 99    Temp 98.7 F (37.1 C)    Resp 20    SpO2 96%  Physical Exam Vitals and nursing note reviewed.  Constitutional:      General: He is not in acute distress.    Appearance: He is well-developed.  HENT:     Head: Atraumatic.     Nose: Nose normal.      Mouth/Throat:     Mouth: Mucous membranes are moist.  Eyes:     Conjunctiva/sclera: Conjunctivae normal.  Cardiovascular:     Rate and Rhythm: Normal rate and regular rhythm.     Pulses: Normal pulses.     Heart sounds: Normal heart sounds.  Pulmonary:     Effort: Pulmonary effort is normal.     Breath sounds: Normal breath sounds. No wheezing, rhonchi or rales.  Musculoskeletal:     Cervical back: Neck supple. No rigidity.  Skin:    Findings: No rash.  Neurological:     Mental Status: He is alert.    ED Results / Procedures / Treatments   Labs (all labs ordered are listed, but only abnormal results are displayed) Labs Reviewed  RESP PANEL BY RT-PCR (FLU A&B, COVID) ARPGX2 - Abnormal; Notable for the following components:      Result Value   Influenza A by PCR POSITIVE (*)    All other components within normal limits    EKG None  Radiology No results found.  Procedures Procedures    Medications Ordered in ED Medications - No data to display  ED Course/ Medical Decision Making/ A&P                           Medical Decision Making Problems Addressed: Influenza A: acute illness or injury    Details: -  treat with tamiflu -tessalon for cough -tylenol for fever and body aches  Amount and/or Complexity of Data Reviewed Labs: ordered. Decision-making details documented in ED Course.  Risk Prescription drug management. Diagnosis or treatment significantly limited by social determinants of health.   BP (!) 144/103    Pulse 99    Temp 98.7 F (37.1 C)    Resp 20    SpO2 96%   3:55 PM Patient with history of HIV, compliant with his medication presenting with cold symptoms since yesterday.  Patient overall well-appearing, vital signs stable, viral respiratory panel obtained and interpreted by me which shows positive influenza A.  Findings consistent with patient presentation.  I have considered chest x-ray however lungs clear to auscultation low suspicion for  pneumonia.  I have considered obtaining labs however patient is not hypoxic and will not require to be admitted. Patient is within the window for treatment with Tamiflu.  We will also provide symptomatic care, and return precaution given.  Jose Carter was evaluated in Emergency Department on 01/29/2022 for the symptoms described in the history of present illness. He was evaluated in the context of the global COVID-19 pandemic, which necessitated consideration that the patient might be at risk for infection with the SARS-CoV-2 virus that causes COVID-19. Institutional protocols and algorithms that pertain to the evaluation of patients at risk for COVID-19 are in a state of rapid change based on information released by regulatory bodies including the CDC and federal and state organizations. These policies and algorithms were followed during the patient's care in the ED.         Final Clinical Impression(s) / ED Diagnoses Final diagnoses:  Influenza A    Rx / DC Orders ED Discharge Orders          Ordered    oseltamivir (TAMIFLU) 75 MG capsule  Every 12 hours        01/29/22 1601    benzonatate (TESSALON) 100 MG capsule  Every 8 hours        01/29/22 1601    acetaminophen (TYLENOL) 500 MG tablet  Every 6 hours PRN        01/29/22 1601              Fayrene Helper, PA-C 01/29/22 1602    Benjiman Core, MD 01/30/22 [phone]

## 2022-01-29 NOTE — ED Notes (Signed)
Dc instructions reviewed with patient. Patient voiced understanding. Dc with belongings.  °

## 2022-01-29 NOTE — ED Triage Notes (Signed)
Pt presents with generalized body aches, cough, "body burning inside", cold chills x1 day.  Negative covid test yesterday at home

## 2022-02-25 ENCOUNTER — Telehealth: Payer: Self-pay

## 2022-02-25 ENCOUNTER — Other Ambulatory Visit: Payer: Self-pay

## 2022-02-25 ENCOUNTER — Encounter: Payer: Self-pay | Admitting: Internal Medicine

## 2022-02-25 ENCOUNTER — Ambulatory Visit (INDEPENDENT_AMBULATORY_CARE_PROVIDER_SITE_OTHER): Payer: Self-pay | Admitting: Internal Medicine

## 2022-02-25 DIAGNOSIS — M25461 Effusion, right knee: Secondary | ICD-10-CM

## 2022-02-25 DIAGNOSIS — M25561 Pain in right knee: Secondary | ICD-10-CM

## 2022-02-25 NOTE — Progress Notes (Signed)
? ?  Subjective:  ? ? Patient ID: WOLFE CAMARENA, male    DOB: 1976-11-24, 46 y.o.   MRN: [id number] ? ?HPI ?He is here for a work in visit ?He hyperextended his right knee with subsequent onset of swelling and pain. He has noted his patella shifted over and this occurred about 2 weeks ago.  He was evaluated by orthopedics but not covered by insurance or workers comp so could not continue.  He is still able to walk but has pain.  Has tried his sisters meloxicam with good relief.   ? ? ?Review of Systems  ?Constitutional:  Negative for fatigue and unexpected weight change.  ?Musculoskeletal:  Positive for arthralgias and joint swelling.  ? ?   ?Objective:  ? Physical Exam ?Musculoskeletal:  ?   Comments: Right knee with mild warmth, some edema; good range of motion  ? ? ? ? ? ?   ?Assessment & Plan:  ? ? ?

## 2022-02-25 NOTE — Telephone Encounter (Signed)
Patient walked in office today requesting to be seen complaining of knee pain. Patient states he went [facility] to be seen, but was unable to be seen due to being uninsured. Patient scheduled today with Dr. Linus Salmons ?

## 2022-02-25 NOTE — Assessment & Plan Note (Signed)
New problem from hyperextension and has not improved after two weeks.  I will have him referred to sports medicine for evaluation.   ?

## 2022-02-27 ENCOUNTER — Ambulatory Visit
Admission: RE | Admit: 2022-02-27 | Discharge: 2022-02-27 | Disposition: A | Discharge status: Home / Self Care | Payer: Self-pay | Source: Ambulatory Visit | Attending: Sports Medicine | Admitting: Sports Medicine

## 2022-02-27 ENCOUNTER — Ambulatory Visit (INDEPENDENT_AMBULATORY_CARE_PROVIDER_SITE_OTHER): Payer: Self-pay | Admitting: Sports Medicine

## 2022-02-27 VITALS — BP 155/91 | Ht 73.0 in | Wt 245.0 lb

## 2022-02-27 DIAGNOSIS — M25561 Pain in right knee: Secondary | ICD-10-CM

## 2022-02-27 MED ORDER — MELOXICAM 15 MG PO TABS: 15.0000 mg | ORAL_TABLET | Freq: Every day | ORAL | 0 refills | Status: DC

## 2022-02-27 NOTE — Patient Instructions (Addendum)
Mr Leanord Thibeau, ? ?It was a pleasure seeing you in clinic. Today we discussed:  ? ?Right knee pain: ?- X-ray of your right knee ordered  ?- Take meloxicam once daily with food as needed for pain ?- No NSAIDs (Ibuprofen, Aleve, Advil, Goody Powder, BC powder, Aspirin/Bayer) while taking meloxicam  ?- Quad strengthening exercises provided. Please start these once pain is improved  ?- Follow up in 3-4 weeks if no improvement ? ?Thank you, we look forward to helping you remain healthy! ? ? ?

## 2022-02-27 NOTE — Progress Notes (Signed)
PCP: No primary care provider on file. ? ?Subjective:  ? ?HPI: ?Patient is a 46 y.o. male here for right knee pain that started on Jan 13th while he was walking on flat surface at work. He works at [employer]. He noticed some popping of his right knee which he felt like it was popping in and out of socket. He reports that he was evaluated for this 3-4 days later and was informed he had patellar dislocation. He has been using ice, knee brace, meloxicam and ibuprofen. Notes that the meloxicam has helped the most with pain control. He notes that he is currently unable to bear weight on the knee. He feels like his knee is popping out after every few steps.  ? ?Past Medical History:  ?Diagnosis Date  ? HIV infection (HCC)   ? ? ?Current Outpatient Medications on File Prior to Visit  ?Medication Sig Dispense Refill  ? acetaminophen (TYLENOL) 500 MG tablet Take 1 tablet (500 mg total) by mouth every 6 (six) hours as needed. 30 tablet 0  ? aspirin 325 MG EC tablet Take 325 mg by mouth every 6 (six) hours as needed for pain.    ? bictegravir-emtricitabine-tenofovir AF (BIKTARVY) 50-200-25 MG TABS tablet Take 1 tablet by mouth daily. (Patient not taking: Reported on 02/25/2022) 30 tablet 11  ? fexofenadine (ALLEGRA) 60 MG tablet Take 60 mg by mouth 2 (two) times daily. (Patient not taking: Reported on 02/25/2022)    ? fluticasone (FLONASE) 50 MCG/ACT nasal spray Place into both nostrils daily.    ? ?No current facility-administered medications on file prior to visit.  ? ? ?No past surgical history on file. ? ?Allergies  ?Allergen Reactions  ? Sustiva [Efavirenz] Swelling  ?  Pt states he was told by prison physician to list Sustiva as an allergy.   ? ? ?BP (!) 155/91   Ht 6\' 1"  (1.854 m)   Wt 245 lb (111.1 kg)   BMI 32.32 kg/m?  ? ?No flowsheet data found. ? ?No flowsheet data found. ? ?    ?Objective:  ?Physical Exam: ? ?Gen: NAD, comfortable in exam room ?R Knee: ?- Inspection: no gross deformity. No swelling/effusion, erythema or  bruising. Skin intact ?- Palpation: TTP at suprapatellar and infrapatellar region; no joint line tenderness; no patellar tenderness  ?- ROM: full active ROM with flexion of knee and hip; knee extension limited due to pain ?- Strength: 5/5 strength ?- Neuro/vasc: NV intact ?- Special Tests: ?- LIGAMENTS: negative anterior drawer/negative Lachman's, unable to assess for MCL/LCL laxity due to pain ?-- MENISCUS: negative McMurray's ?-- PF JOINT: nml patellar mobility bilaterally, although pain with manipulation. negative patellar apprehension ?Hips: normal ROM, negative FABER and FADIR bilaterally ? ?Ultrasound: ?Normal ligaments, tendons, patella. No bony abnormalities appreciated. No effusion appreciated.  ?  ?Assessment & Plan:  ?1. R knee pain ?Patient is presenting with two months of right knee pain that is progressively worsening. Patient states that he was previously informed that he had a patellar dislocation. He endorses a "popping" sensation in his right knee after every few steps which has become painful. Patient is unable to completely extend the right knee due to pain and has difficulty bearing weight on the right knee. Physical exam without obvious deformity, swelling or effusion. There is tenderness to palpation at the supra and infrapatellar region without joint line or patellar tenderness. Patellar mobility bilaterally nl with negative patellar apprehension test. There is no ligamentous, tendonous or bony abnormality or effusion noted on ultrasound.  ?  Will obtain X-ray of right knee, give short course of meloxicam 15mg  daily for 3 weeks. Patient advised to avoid NSAIDs while taking meloxicam. ROM exercises provided. Patient to follow up in 3-4 weeks if no improvement of symptoms  ? ? , MD ?Internal Medicine, PGY-3 ?02/27/22 12:44 PM ? ?Sports Medicine Fellow Addendum: ?  ?I have independently interviewed and examined the patient. I have discussed the above with the original author and agree  with their documentation. My edits for correction/addition/clarification have been made, see any changes above and below. Any attestation will be made below from the attending.  ? ?In summary, 46 year old male with 2 months of right knee pain after event while walking when he felt like the knee had a few episodes of "popping."  Denies prior history of patellar dislocation or subluxation.  Patient told 59 today in the room that he went to [facility] for evaluation of the knee, although I cannot see any records of this (which would be in Epic).  Inspection of the knee demonstrates no effusion.  He does have limited range of motion in all directions with some guarding.  He does have pain with manipulation of the patella although no apprehension.  The medial retinaculum does appear to have a opponent of a fibrous band suggestive of plica syndrome.  Unable to assess ligamental testing secondary to patient's guarding and restriction in range of motion.  Low threshold for patellar subluxation/dislocation given no history and no effusion on exam today, however given his pain we will proceed with x-rays to rule out any bony pathology.  We will provide a short course of meloxicam to be taken once daily as needed.  He will follow-up in 3 to 4 weeks if not having improvement in his symptoms.  He may continue to wear his knee brace as desired at work. ? ?[facility], DO ?PGY-4, Sports Medicine Fellow ?[facility] ? ? ?Addendum:  I was the preceptor for this visit and available for immediate consultation.  [facility] MD [facility] ? ?

## 2022-03-19 ENCOUNTER — Encounter: Payer: Self-pay | Admitting: Sports Medicine

## 2022-03-20 ENCOUNTER — Other Ambulatory Visit: Payer: Self-pay | Admitting: Sports Medicine

## 2022-03-20 MED ORDER — MELOXICAM 15 MG PO TABS: 15.0000 mg | ORAL_TABLET | Freq: Every day | ORAL | 0 refills | Status: DC

## 2022-04-01 ENCOUNTER — Ambulatory Visit (INDEPENDENT_AMBULATORY_CARE_PROVIDER_SITE_OTHER): Payer: Self-pay | Admitting: Sports Medicine

## 2022-04-01 VITALS — BP 146/101 | Ht 73.0 in | Wt 245.0 lb

## 2022-04-01 DIAGNOSIS — B20 Human immunodeficiency virus [HIV] disease: Secondary | ICD-10-CM

## 2022-04-01 DIAGNOSIS — M25561 Pain in right knee: Secondary | ICD-10-CM

## 2022-04-01 NOTE — Progress Notes (Addendum)
PCP: Gardiner Barefoot, MD ? ?Subjective:  ? ?HPI: ? ?Jose Carter is a 46 y.o. male here for follow up regarding right knee pain. ? ?Jose Carter states his pain has improved somewhat since his last visit but he still requires Meloxicam at least 5 days per week. He takes Meloxicam generally Monday - Friday but takes a break on the weekends as he is not moving around as much. The pain has moved from overlying the patella to the medial aspect with some radiation down his leg when he is moving around. He denies any calf pain. He states his knee feels "heavy" occasionally. He has been using several different types of knee braces every few days depending on his level of knee pain. He denies any focal weakness, numbness or tingling.  ? ?Past Medical History:  ?Diagnosis Date  ? HIV infection (HCC)   ? ?Current Outpatient Medications on File Prior to Visit  ?Medication Sig Dispense Refill  ? acetaminophen (TYLENOL) 500 MG tablet Take 1 tablet (500 mg total) by mouth every 6 (six) hours as needed. 30 tablet 0  ? aspirin 325 MG EC tablet Take 325 mg by mouth every 6 (six) hours as needed for pain.    ? bictegravir-emtricitabine-tenofovir AF (BIKTARVY) 50-200-25 MG TABS tablet Take 1 tablet by mouth daily. (Jose Carter not taking: Reported on 02/25/2022) 30 tablet 11  ? fexofenadine (ALLEGRA) 60 MG tablet Take 60 mg by mouth 2 (two) times daily. (Jose Carter not taking: Reported on 02/25/2022)    ? fluticasone (FLONASE) 50 MCG/ACT nasal spray Place into both nostrils daily.    ? meloxicam (MOBIC) 15 MG tablet Take 1 tablet (15 mg total) by mouth daily for 21 days. 21 tablet 0  ? ?No current facility-administered medications on file prior to visit.  ? ?No past surgical history on file. ? ?Allergies  ?Allergen Reactions  ? Sustiva [Efavirenz] Swelling  ?  Pt states he was told by prison physician to list Sustiva as an allergy.   ? ?BP (!) 146/101   Ht 6\' 1"  (1.854 m)   Wt 245 lb (111.1 kg)   BMI 32.32 kg/m?  ? ?    ?Objective:  ? ?Physical  Exam: ? ?General: NAD, comfortable in exam room ? ?Right knee:  ?Inspection: Mild swelling overlying the medial aspect of the knee, adjacent to the patella.  ?Palpation: Tenderness to palpation overlying the medial joint line. No other tenderness with palpation only. No effusions palpated.  ?Range of Motion: Intact with no weakness noted.  ?Special Tests: Negative anterior and posterior drawer. McMurray test positive for clicking and pain on medial evaluation but not lateral.  ?  ?Assessment & Plan:  ? ?1. Right knee pain: Jose Carter presenting for follow up regarding right knee pain that has, in total, been occurring for 3 months now. Overall, pain has significantly improved since Jose Carter's last visit, both per Jose Carter and on examination. There is pain on palpation of the medial joint line. We discussed potential steroid injection in the future. There is also some pain with medial McMurray's testing but given marked improvement and lack of insurance, will hold off on ordering MRI. Recommended switching from scheduled Meloxicam to PRN only on severe pain days as Jose Carter has a history of AKI and is on Biktarvy. In addition, recommended restarting PRN Tylenol and application of ice, especially after exercising. Follow up in 3-4 weeks if no further improvements. At that time, will consider intra-articular steroid injection.  ? ?- Discontinue scheduled Meloxicam. Only take PRN  on severe days. ?- Restart Tylenol BID PRN ?- Restart daily Biktarvy - stressed adherence today with Jose Carter ?- Ice after exercising ?- Continue strengthening exercises  ?- Follow up in 2-4 weeks if no improvement  ? ?Discussed and seen with Dr. Shon Baton.  ? ?Sports Medicine Fellow Addendum: ?  ?I have independently interviewed and examined the Jose Carter. I have discussed the above with the original author and agree with their documentation. My edits for correction/addition/clarification have been made, see any changes above and below. Any attestation  will be made below from the attending.  ? ?In summary, 46 year old male here for follow-up of right knee pain. Last visit 4 weeks ago Jose Carter had difficulty ambulating and this has vastly improved. His pain last time was more patellofemoral - this has improved and pain moreso of medial joint line. Mild pain with patellar manipulation. + Pain with McMurray's so consider meniscal involvement but no real effusion and has had vast improvement in pain and function.  He will continue with HEP as he has been doing daily. Discussed importance of maintaining on his Biktarvy, discontinuing Meloxicam (can take for breakthough pain PRN) and switching to Tylenol as needed. If does not continue to improve or pain bothersome, consider CS-injection vs. Additional imaging at future visits.  ? ?Madelyn Brunner, DO ?PGY-4, Sports Medicine Fellow ?[facility] ? ?This note was dictated using Dragon naturally speaking software and may contain errors in syntax, spelling, or content which have not been identified prior to signing this note.  ? ?I was the preceptor for this visit and available for immediate consultation ?Marsa Aris, DO ?

## 2022-04-27 ENCOUNTER — Other Ambulatory Visit: Payer: Self-pay | Admitting: Sports Medicine

## 2022-07-06 ENCOUNTER — Other Ambulatory Visit: Payer: Self-pay

## 2022-07-06 ENCOUNTER — Ambulatory Visit: Payer: Self-pay

## 2022-07-06 DIAGNOSIS — Z113 Encounter for screening for infections with a predominantly sexual mode of transmission: Secondary | ICD-10-CM

## 2022-07-06 DIAGNOSIS — B20 Human immunodeficiency virus [HIV] disease: Secondary | ICD-10-CM

## 2022-07-07 LAB — URINE CYTOLOGY ANCILLARY ONLY
Chlamydia: NEGATIVE
Comment: NEGATIVE
Comment: NORMAL
Neisseria Gonorrhea: NEGATIVE

## 2022-07-07 LAB — T-HELPER CELL (CD4) - (RCID CLINIC ONLY)
CD4 % Helper T Cell: 33 % (ref 33–65)
CD4 T Cell Abs: 649 /uL (ref 400–1790)

## 2022-07-08 LAB — HIV-1 RNA QUANT-NO REFLEX-BLD
HIV 1 RNA Quant: NOT DETECTED Copies/mL
HIV-1 RNA Quant, Log: NOT DETECTED Log cps/mL

## 2022-07-21 ENCOUNTER — Ambulatory Visit (INDEPENDENT_AMBULATORY_CARE_PROVIDER_SITE_OTHER): Payer: Self-pay | Admitting: Internal Medicine

## 2022-07-21 ENCOUNTER — Encounter: Payer: Self-pay | Admitting: Internal Medicine

## 2022-07-21 ENCOUNTER — Other Ambulatory Visit: Payer: Self-pay

## 2022-07-21 VITALS — BP 153/105 | HR 97 | Temp 97.9°F | Wt 255.0 lb

## 2022-07-21 DIAGNOSIS — B20 Human immunodeficiency virus [HIV] disease: Secondary | ICD-10-CM

## 2022-07-21 DIAGNOSIS — Z79899 Other long term (current) drug therapy: Secondary | ICD-10-CM

## 2022-07-21 DIAGNOSIS — Z113 Encounter for screening for infections with a predominantly sexual mode of transmission: Secondary | ICD-10-CM

## 2022-07-21 DIAGNOSIS — Z23 Encounter for immunization: Secondary | ICD-10-CM

## 2022-07-21 DIAGNOSIS — R635 Abnormal weight gain: Secondary | ICD-10-CM

## 2022-07-21 DIAGNOSIS — F419 Anxiety disorder, unspecified: Secondary | ICD-10-CM

## 2022-07-21 MED ORDER — BICTEGRAVIR-EMTRICITAB-TENOFOV 50-200-25 MG PO TABS: 1.0000 | ORAL_TABLET | Freq: Every day | ORAL | 11 refills | Status: DC

## 2022-07-21 NOTE — Assessment & Plan Note (Signed)
Continues to have issues with weight gain.  I suspect medication changes won't offer any benefit.  enouraged improved diet.

## 2022-07-21 NOTE — Progress Notes (Signed)
   Subjective:    Patient ID: Jose Carter, male    DOB: September 24, 1976, 46 y.o.   MRN: [id number]  HPI Here for follow up of HIV He continues on Biktarvy and denies any missed doses.  CD4 649 and viral load not detected.  Has gained some weight and asking about a pill for weight loss.  He has been more anxious recently with some recent deaths in his family.  Is working a new job and happy about that, in [employer].  Recent sexual contact and asking for testing.     Review of Systems  Constitutional:  Negative for fatigue.  Gastrointestinal:  Negative for diarrhea and nausea.  Skin:  Negative for rash.       Objective:   Physical Exam Eyes:     General: No scleral icterus. Pulmonary:     Effort: Pulmonary effort is normal.  Skin:    Findings: No rash.  Neurological:     General: No focal deficit present.     Mental Status: He is alert.  Psychiatric:        Mood and Affect: Mood normal.   SH: continues to remain drug-free and tobacco-free        Assessment & Plan:

## 2022-07-21 NOTE — Addendum Note (Signed)
Addended by: Juanita Laster on: 07/21/2022 11:31 AM   Modules accepted: Orders

## 2022-07-21 NOTE — Assessment & Plan Note (Signed)
He has had recent stressors and some anxious feelings.  He will be scheduled with our counselor.

## 2022-07-21 NOTE — Assessment & Plan Note (Signed)
He continues to do well on Biktarvy and no indication for any changes.  Refills sent.  rtc in 6 months.

## 2022-07-22 LAB — CYTOLOGY, (ORAL, ANAL, URETHRAL) ANCILLARY ONLY
Chlamydia: NEGATIVE
Comment: NEGATIVE
Comment: NORMAL
Neisseria Gonorrhea: NEGATIVE

## 2022-09-01 ENCOUNTER — Telehealth: Payer: Self-pay

## 2022-09-01 NOTE — Telephone Encounter (Signed)
Patient called complaining of a sore throat x 3 days. Patient denies any other symptoms. Patient requesting a prescription for bactrim. Patient reports that he had some leftover Azithromycin which he has been taking. Patient was also advised that he could go to UC to be evaluated as well.  Please advise Jose Carter Pricilla Loveless

## 2022-09-02 ENCOUNTER — Other Ambulatory Visit: Payer: Self-pay

## 2022-09-02 ENCOUNTER — Encounter: Payer: Self-pay | Admitting: Internal Medicine

## 2022-09-02 ENCOUNTER — Ambulatory Visit (INDEPENDENT_AMBULATORY_CARE_PROVIDER_SITE_OTHER): Payer: Self-pay | Admitting: Internal Medicine

## 2022-09-02 VITALS — BP 163/95 | HR 76 | Temp 97.6°F | Wt 249.0 lb

## 2022-09-02 DIAGNOSIS — R635 Abnormal weight gain: Secondary | ICD-10-CM

## 2022-09-02 DIAGNOSIS — B20 Human immunodeficiency virus [HIV] disease: Secondary | ICD-10-CM

## 2022-09-02 DIAGNOSIS — J029 Acute pharyngitis, unspecified: Secondary | ICD-10-CM | POA: Insufficient documentation

## 2022-09-02 DIAGNOSIS — R21 Rash and other nonspecific skin eruption: Secondary | ICD-10-CM

## 2022-09-02 DIAGNOSIS — Z113 Encounter for screening for infections with a predominantly sexual mode of transmission: Secondary | ICD-10-CM

## 2022-09-02 NOTE — Assessment & Plan Note (Signed)
+   lymphadenopathy.  Has already taken amoxicillin.  Will swab for GC/chlamydia

## 2022-09-02 NOTE — Assessment & Plan Note (Addendum)
Asking about Egrifta I think it will be ok for him to start this.  Since he is getting insurance next month with his new job, will wait for this and can run it to see if covered.

## 2022-09-02 NOTE — Progress Notes (Signed)
   Subjective:    Patient ID: Jose Carter, male    DOB: July 12, 1976, 46 y.o.   MRN: [id number]  HPI He is here for a work in visit for concern for STI He recently developed a sore throat.  He reports recent kissing with a new partner.  Has taken amoxicillin.  Also asking about Cabenuva. Rash on his hands.    Review of Systems  Constitutional:  Negative for chills, fatigue and fever.  Gastrointestinal:  Negative for diarrhea.  Skin:  Positive for rash.       Objective:   Physical Exam Eyes:     General: No scleral icterus. Pulmonary:     Effort: Pulmonary effort is normal.  Skin:    Findings: Rash present.  Neurological:     General: No focal deficit present.     Mental Status: He is alert.   SH: no drug use        Assessment & Plan:

## 2022-09-02 NOTE — Telephone Encounter (Signed)
Patient scheduled for sti testing at 4:15 today.

## 2022-09-02 NOTE — Assessment & Plan Note (Signed)
Skin eruption on his hands.  Looks like contact derm.  Peeling.  Topical steroids recommended.

## 2022-09-02 NOTE — Assessment & Plan Note (Addendum)
He continues to do well, no issues.  Follow up arranged.  Once he gets his insurance in the next 2 weeks or so, he will let us know and we can see about starting him on Cabenuva if covered.

## 2022-09-04 ENCOUNTER — Telehealth: Payer: Self-pay

## 2022-09-04 LAB — URINE CYTOLOGY ANCILLARY ONLY
Chlamydia: NEGATIVE
Comment: NEGATIVE
Comment: NORMAL
Neisseria Gonorrhea: NEGATIVE

## 2022-09-04 LAB — CYTOLOGY, (ORAL, ANAL, URETHRAL) ANCILLARY ONLY
Chlamydia: NEGATIVE
Comment: NEGATIVE
Comment: NORMAL
Neisseria Gonorrhea: NEGATIVE

## 2022-09-04 NOTE — Telephone Encounter (Signed)
-----   Message from Gardiner Barefoot, MD sent at 09/04/2022 12:09 PM EDT ----- Please let him know his swabs were negative.  thanks

## 2023-01-12 ENCOUNTER — Other Ambulatory Visit: Payer: Self-pay

## 2023-01-26 ENCOUNTER — Ambulatory Visit: Payer: Self-pay | Admitting: Internal Medicine

## 2023-01-26 ENCOUNTER — Telehealth: Payer: Self-pay

## 2023-01-26 NOTE — Telephone Encounter (Signed)
Attempted to contact to reschedule due to no show on 01/26/23 with Dr Linus Salmons at 10:30 AM. Call could not be completed at this time per operator.

## 2023-03-01 ENCOUNTER — Other Ambulatory Visit: Payer: Self-pay

## 2023-03-01 DIAGNOSIS — Z79899 Other long term (current) drug therapy: Secondary | ICD-10-CM

## 2023-03-01 DIAGNOSIS — B20 Human immunodeficiency virus [HIV] disease: Secondary | ICD-10-CM

## 2023-03-01 DIAGNOSIS — Z113 Encounter for screening for infections with a predominantly sexual mode of transmission: Secondary | ICD-10-CM

## 2023-03-02 LAB — T-HELPER CELL (CD4) - (RCID CLINIC ONLY)
CD4 % Helper T Cell: 27 % — ABNORMAL LOW (ref 33–65)
CD4 T Cell Abs: 602 /uL (ref 400–1790)

## 2023-03-04 LAB — CBC WITH DIFFERENTIAL/PLATELET
Absolute Monocytes: 475 cells/uL (ref 200–950)
Basophils Absolute: 40 cells/uL (ref 0–200)
Basophils Relative: 0.8 %
Eosinophils Absolute: 120 cells/uL (ref 15–500)
Eosinophils Relative: 2.4 %
HCT: 38.9 % (ref 38.5–50.0)
Hemoglobin: 12.8 g/dL — ABNORMAL LOW (ref 13.2–17.1)
Lymphs Abs: 2290 cells/uL (ref 850–3900)
MCH: 27.4 pg (ref 27.0–33.0)
MCHC: 32.9 g/dL (ref 32.0–36.0)
MCV: 83.3 fL (ref 80.0–100.0)
MPV: 12.5 fL (ref 7.5–12.5)
Monocytes Relative: 9.5 %
Neutro Abs: 2075 cells/uL (ref 1500–7800)
Neutrophils Relative %: 41.5 %
Platelets: 269 10*3/uL (ref 140–400)
RBC: 4.67 10*6/uL (ref 4.20–5.80)
RDW: 14.6 % (ref 11.0–15.0)
Total Lymphocyte: 45.8 %
WBC: 5 10*3/uL (ref 3.8–10.8)

## 2023-03-04 LAB — COMPLETE METABOLIC PANEL WITH GFR
AG Ratio: 1.7 (calc) (ref 1.0–2.5)
ALT: 49 U/L — ABNORMAL HIGH (ref 9–46)
AST: 44 U/L — ABNORMAL HIGH (ref 10–40)
Albumin: 4.3 g/dL (ref 3.6–5.1)
Alkaline phosphatase (APISO): 41 U/L (ref 36–130)
BUN: 15 mg/dL (ref 7–25)
CO2: 27 mmol/L (ref 20–32)
Calcium: 9.4 mg/dL (ref 8.6–10.3)
Chloride: 108 mmol/L (ref 98–110)
Creat: 1.27 mg/dL (ref 0.60–1.29)
Globulin: 2.5 g/dL (calc) (ref 1.9–3.7)
Glucose, Bld: 94 mg/dL (ref 65–99)
Potassium: 4.6 mmol/L (ref 3.5–5.3)
Sodium: 141 mmol/L (ref 135–146)
Total Bilirubin: 0.4 mg/dL (ref 0.2–1.2)
Total Protein: 6.8 g/dL (ref 6.1–8.1)
eGFR: 71 mL/min/{1.73_m2} (ref >=60)

## 2023-03-04 LAB — HIV-1 RNA QUANT-NO REFLEX-BLD
HIV 1 RNA Quant: NOT DETECTED Copies/mL
HIV-1 RNA Quant, Log: NOT DETECTED Log cps/mL

## 2023-03-04 LAB — LIPID PANEL
Cholesterol: 141 mg/dL (ref <200)
HDL: 25 mg/dL — ABNORMAL LOW (ref >=40)
LDL Cholesterol (Calc): 101 mg/dL (calc) — ABNORMAL HIGH
Non-HDL Cholesterol (Calc): 116 mg/dL (calc) (ref <130)
Total CHOL/HDL Ratio: 5.6 (calc) — ABNORMAL HIGH (ref <5.0)
Triglycerides: 65 mg/dL (ref <150)

## 2023-03-04 LAB — RPR: RPR Ser Ql: NONREACTIVE

## 2023-03-16 ENCOUNTER — Encounter: Payer: Self-pay | Admitting: Internal Medicine

## 2023-03-16 ENCOUNTER — Ambulatory Visit (INDEPENDENT_AMBULATORY_CARE_PROVIDER_SITE_OTHER): Payer: Self-pay | Admitting: Internal Medicine

## 2023-03-16 ENCOUNTER — Other Ambulatory Visit: Payer: Self-pay

## 2023-03-16 VITALS — BP 137/89 | HR 71 | Temp 97.6°F | Ht 73.0 in | Wt 272.0 lb

## 2023-03-16 DIAGNOSIS — B20 Human immunodeficiency virus [HIV] disease: Secondary | ICD-10-CM

## 2023-03-16 DIAGNOSIS — R7401 Elevation of levels of liver transaminase levels: Secondary | ICD-10-CM

## 2023-03-16 DIAGNOSIS — Z113 Encounter for screening for infections with a predominantly sexual mode of transmission: Secondary | ICD-10-CM

## 2023-03-16 MED ORDER — BICTEGRAVIR-EMTRICITAB-TENOFOV 50-200-25 MG PO TABS: 1.0000 | ORAL_TABLET | Freq: Every day | ORAL | 11 refills | Status: DC

## 2023-03-16 NOTE — Progress Notes (Signed)
   Subjective:    Patient ID: Jose Carter, male    DOB: May 07, 1976, 47 y.o.   MRN: [id number]  HPI Jose Carter is here for follow up of HIV He continues on Biktarvy with no missed doses.  He is taking supplements to bulk up but also has gained some weight.  Having some pain in his left great toe at the MTP joint.  Takes a OTC diuretic for knee swelling.    Review of Systems  Constitutional:  Negative for fatigue.  Gastrointestinal:  Negative for diarrhea.  Skin:  Negative for rash.       Objective:   Physical Exam Eyes:     General: No scleral icterus. Pulmonary:     Effort: Pulmonary effort is normal.  Neurological:     Mental Status: He is alert.    SH: no tobacco       Assessment & Plan:

## 2023-03-16 NOTE — Assessment & Plan Note (Addendum)
New finding.  I suspect it is mainly related to supplements he is taking and advised against taking these supplements.   I suspect he is having gout from these supplements or the OTC diuretic and advised against that.   Will check viral hepatitis labs next visit.

## 2023-03-16 NOTE — Assessment & Plan Note (Signed)
Screened negative 

## 2023-03-16 NOTE — Assessment & Plan Note (Signed)
He continues to do well on Biktarvy and no changes indicated.  Labs reviewed with him.  Will discuss Reprieve next visit but with some transamanitis, will defer for now.

## 2023-06-08 NOTE — Progress Notes (Signed)
The 10-year ASCVD risk score (Arnett DK, et al., 2019) is: 5.2%   Values used to calculate the score:     Age: 47 years     Sex: Male     Is Non-Hispanic African American: Yes     Diabetic: No     Tobacco smoker: No     Systolic Blood Pressure: 137 mmHg     Is BP treated: No     HDL Cholesterol: 25 mg/dL     Total Cholesterol: 141 mg/dL  Sandie Ano, RN

## 2023-06-19 IMAGING — CR DG KNEE AP/LAT W/ SUNRISE*R*
3 series · 3 of 3 positions shown · non-contrast
Comparison: None.

CLINICAL DATA: Right lateral knee pain with weakness.

EXAM:
RIGHT KNEE 3 VIEWS

[w knee ap right]
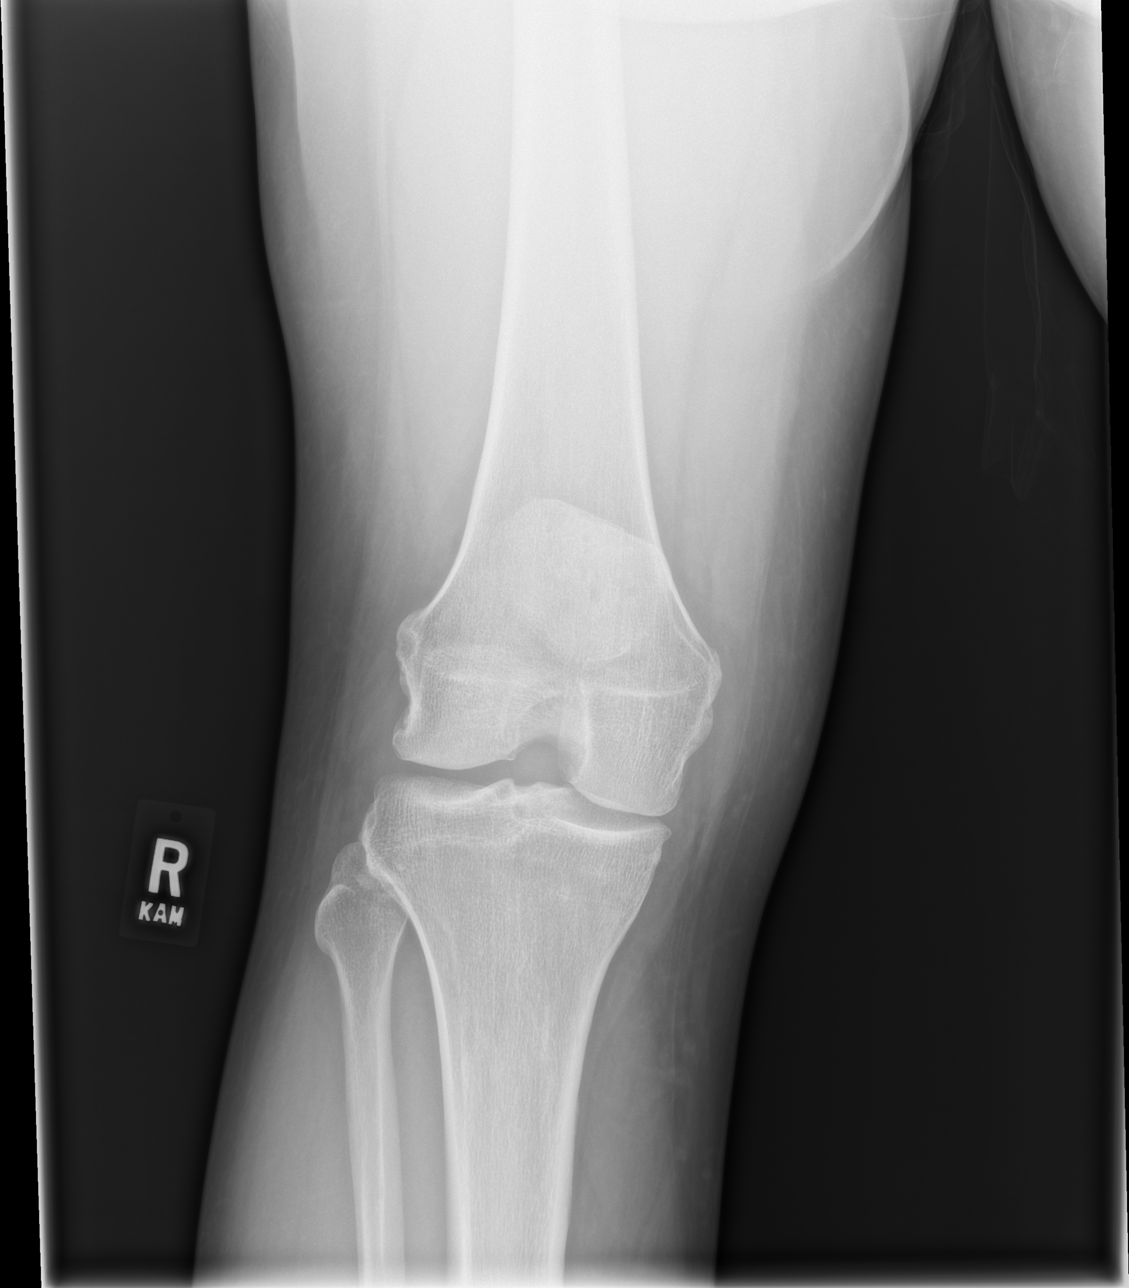

[w knee lat right]
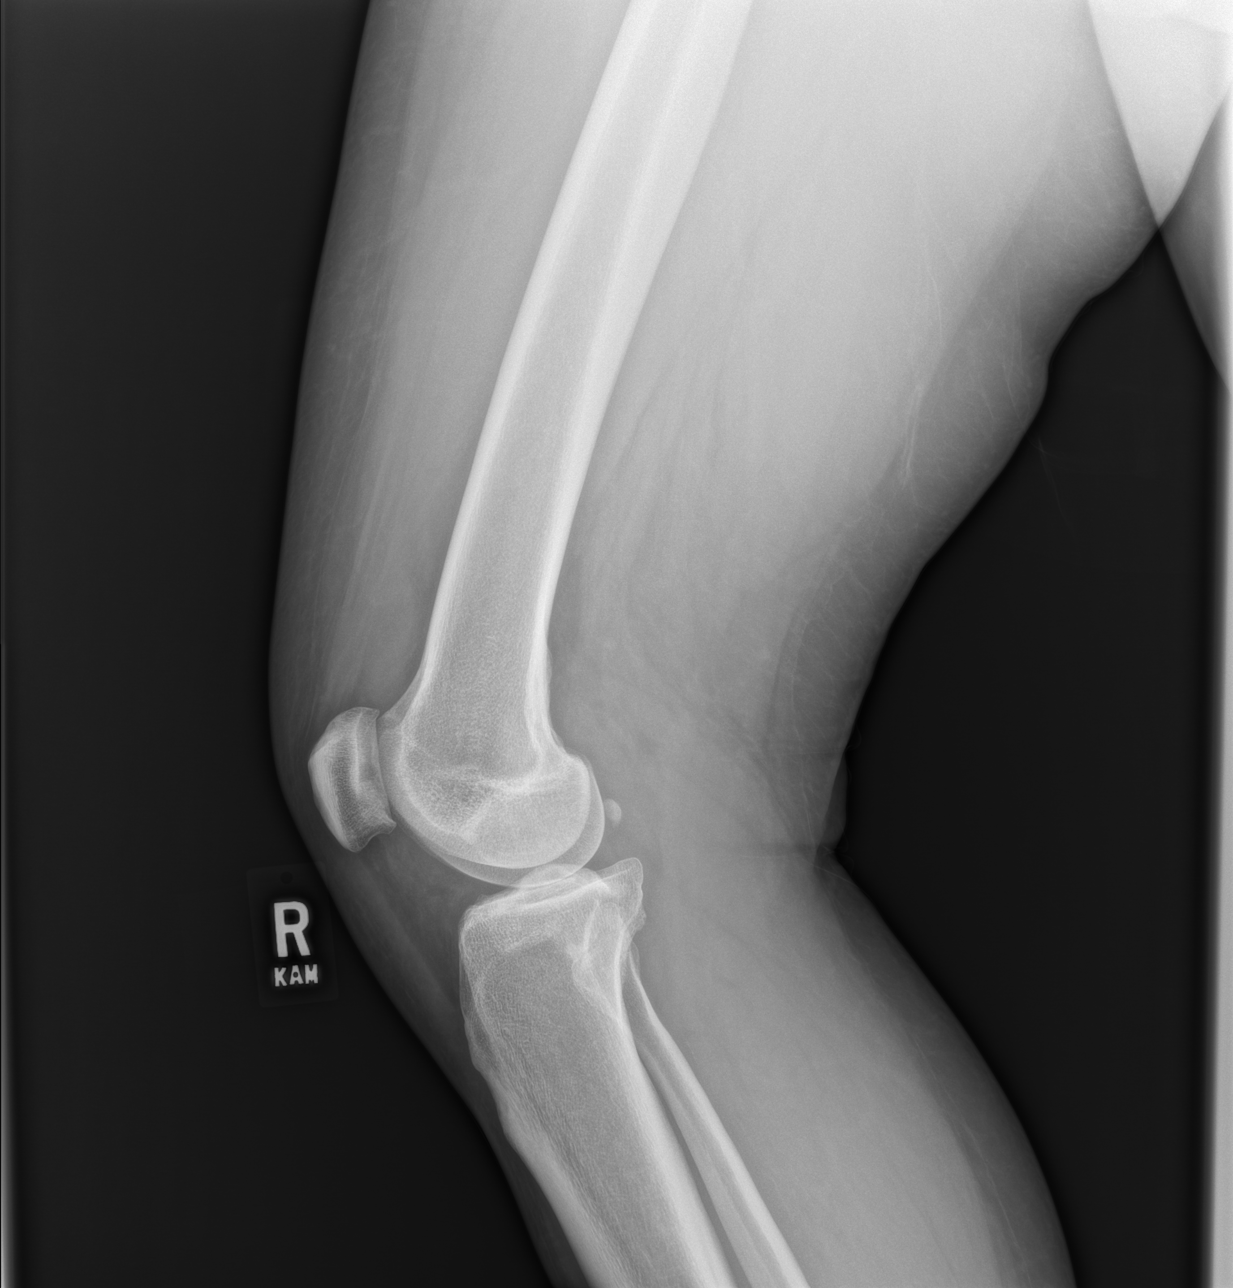

[x knee sunrise right]
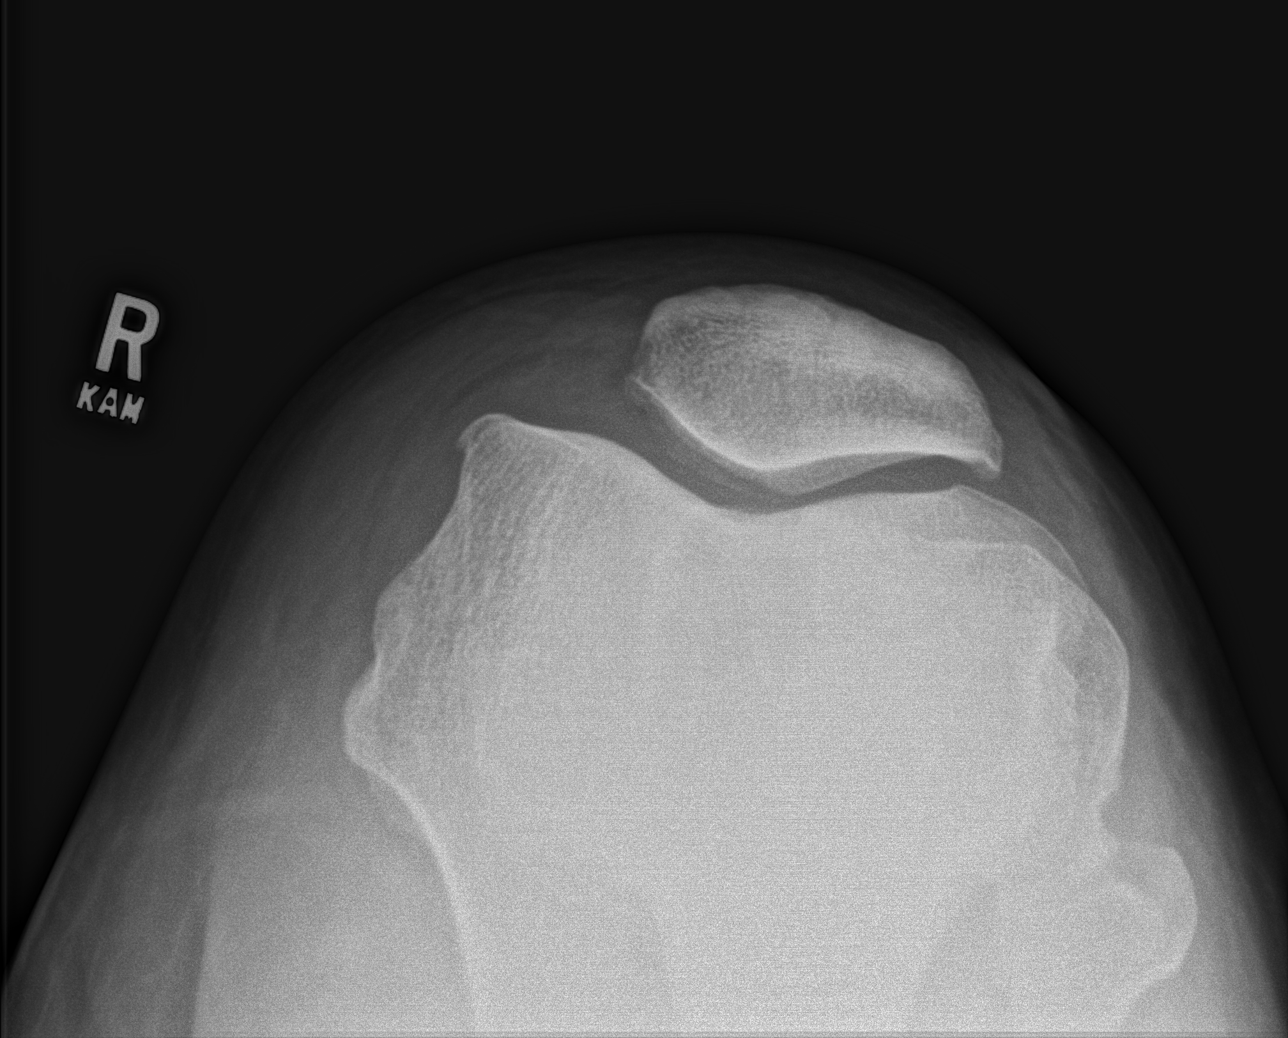

[3 of 3 positions shown; findings below may reference images not displayed]

FINDINGS: There is no acute fracture or dislocation. The bones are well
mineralized. Mild arthritic changes of the knee with a small
suprapatellar effusion. The soft tissues are unremarkable.
IMPRESSION: 1. No acute fracture or dislocation.
2. Mild arthritic changes and a small suprapatellar effusion

## 2023-08-07 ENCOUNTER — Emergency Department (HOSPITAL_BASED_OUTPATIENT_CLINIC_OR_DEPARTMENT_OTHER)
Admission: EM | Admit: 2023-08-07 | Discharge: 2023-08-07 | Disposition: A | Discharge status: Home / Self Care | Payer: Self-pay | Attending: Emergency Medicine | Admitting: Emergency Medicine

## 2023-08-07 ENCOUNTER — Encounter (HOSPITAL_BASED_OUTPATIENT_CLINIC_OR_DEPARTMENT_OTHER): Payer: Self-pay

## 2023-08-07 ENCOUNTER — Other Ambulatory Visit: Payer: Self-pay

## 2023-08-07 DIAGNOSIS — S0181XA Laceration without foreign body of other part of head, initial encounter: Secondary | ICD-10-CM | POA: Insufficient documentation

## 2023-08-07 DIAGNOSIS — S0501XA Injury of conjunctiva and corneal abrasion without foreign body, right eye, initial encounter: Secondary | ICD-10-CM | POA: Insufficient documentation

## 2023-08-07 DIAGNOSIS — F17200 Nicotine dependence, unspecified, uncomplicated: Secondary | ICD-10-CM | POA: Insufficient documentation

## 2023-08-07 DIAGNOSIS — W228XXA Striking against or struck by other objects, initial encounter: Secondary | ICD-10-CM | POA: Insufficient documentation

## 2023-08-07 DIAGNOSIS — Z23 Encounter for immunization: Secondary | ICD-10-CM | POA: Insufficient documentation

## 2023-08-07 DIAGNOSIS — B20 Human immunodeficiency virus [HIV] disease: Secondary | ICD-10-CM | POA: Insufficient documentation

## 2023-08-07 MED ORDER — IBUPROFEN 800 MG PO TABS
800.0000 mg | ORAL_TABLET | Freq: Once | ORAL | Status: AC
  Administered 2023-08-07: 800 mg via ORAL
  Filled 2023-08-07: qty 1

## 2023-08-07 MED ORDER — ERYTHROMYCIN 5 MG/GM OP OINT: TOPICAL_OINTMENT | OPHTHALMIC | 0 refills | Status: DC

## 2023-08-07 MED ORDER — FLUORESCEIN SODIUM 1 MG OP STRP
ORAL_STRIP | OPHTHALMIC | Status: AC
  Administered 2023-08-07: 1
  Filled 2023-08-07: qty 1

## 2023-08-07 MED ORDER — LIDOCAINE HCL (PF) 1 % IJ SOLN
10.0000 mL | Freq: Once | INTRAMUSCULAR | Status: AC
  Administered 2023-08-07: 10 mL via INTRADERMAL
  Filled 2023-08-07: qty 10

## 2023-08-07 MED ORDER — TETANUS-DIPHTH-ACELL PERTUSSIS 5-2.5-18.5 LF-MCG/0.5 IM SUSY
0.5000 mL | PREFILLED_SYRINGE | Freq: Once | INTRAMUSCULAR | Status: AC
  Administered 2023-08-07: 0.5 mL via INTRAMUSCULAR
  Filled 2023-08-07: qty 0.5
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
## 2023-08-07 NOTE — ED Notes (Signed)
Pt's wounds cleaned with NS. Pt tolerated well.

## 2023-08-07 NOTE — ED Notes (Signed)
Pt has right eye covered with a bandage. Visual acuity preformed on lt eye. Pt wears glasses.
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
## 2023-08-10 ENCOUNTER — Other Ambulatory Visit: Payer: Self-pay

## 2023-08-10 ENCOUNTER — Ambulatory Visit: Payer: Self-pay

## 2023-09-14 ENCOUNTER — Other Ambulatory Visit: Payer: Self-pay

## 2023-09-14 ENCOUNTER — Encounter: Payer: Self-pay | Admitting: Internal Medicine

## 2023-09-14 ENCOUNTER — Ambulatory Visit (INDEPENDENT_AMBULATORY_CARE_PROVIDER_SITE_OTHER): Payer: Self-pay | Admitting: Internal Medicine

## 2023-09-14 VITALS — BP 136/78 | HR 78 | Resp 16 | Ht 73.0 in | Wt 262.0 lb

## 2023-09-14 DIAGNOSIS — R3589 Other polyuria: Secondary | ICD-10-CM

## 2023-09-14 DIAGNOSIS — R7401 Elevation of levels of liver transaminase levels: Secondary | ICD-10-CM

## 2023-09-14 DIAGNOSIS — Z113 Encounter for screening for infections with a predominantly sexual mode of transmission: Secondary | ICD-10-CM

## 2023-09-14 DIAGNOSIS — B20 Human immunodeficiency virus [HIV] disease: Secondary | ICD-10-CM

## 2023-09-14 DIAGNOSIS — R350 Frequency of micturition: Secondary | ICD-10-CM

## 2023-09-14 DIAGNOSIS — Z23 Encounter for immunization: Secondary | ICD-10-CM

## 2023-09-14 MED ORDER — BICTEGRAVIR-EMTRICITAB-TENOFOV 50-200-25 MG PO TABS: 1.0000 | ORAL_TABLET | Freq: Every day | ORAL | 11 refills | Status: DC

## 2023-09-14 NOTE — Assessment & Plan Note (Addendum)
He continues to do well, no concerns and refills provided.  Labs today and rtc in 6 months.   I have personally spent 42 minutes involved in face-to-face and non-face-to-face activities for this patient on the day of the visit. Professional time spent includes the following activities: Preparing to see the patient (review of tests), Obtaining and/or reviewing separately obtained history (admission/discharge record), Performing a medically appropriate examination and/or evaluation , Ordering medications/tests/procedures, referring and communicating with other health care professionals, Documenting clinical information in the EMR, Independently interpreting results (not separately reported), Communicating results to the patient/family/caregiver, Counseling and educating the patient/family/caregiver and Care coordination (not separately reported).

## 2023-09-14 NOTE — Assessment & Plan Note (Signed)
Discussed flu and COVID vaccines and given today

## 2023-09-14 NOTE — Assessment & Plan Note (Signed)
Will screen 

## 2023-09-14 NOTE — Assessment & Plan Note (Signed)
Same complaint; I suspect related to caffeine.  Will check creat, glucose

## 2023-09-14 NOTE — Assessment & Plan Note (Signed)
Elevated previously while on muscle supplements.  He has been off and I will recheck today
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
## 2023-09-15 LAB — CYTOLOGY, (ORAL, ANAL, URETHRAL) ANCILLARY ONLY
Chlamydia: NEGATIVE
Comment: NEGATIVE
Comment: NORMAL
Neisseria Gonorrhea: NEGATIVE

## 2023-09-15 LAB — URINE CYTOLOGY ANCILLARY ONLY
Chlamydia: NEGATIVE
Comment: NEGATIVE
Comment: NORMAL
Neisseria Gonorrhea: NEGATIVE

## 2023-09-16 LAB — COMPLETE METABOLIC PANEL WITH GFR
AG Ratio: 1.7 (calc) (ref 1.0–2.5)
ALT: 16 U/L (ref 9–46)
AST: 20 U/L (ref 10–40)
Albumin: 4.5 g/dL (ref 3.6–5.1)
Alkaline phosphatase (APISO): 72 U/L (ref 36–130)
BUN/Creatinine Ratio: 13 (calc) (ref 6–22)
BUN: 18 mg/dL (ref 7–25)
CO2: 27 mmol/L (ref 20–32)
Calcium: 9.7 mg/dL (ref 8.6–10.3)
Chloride: 106 mmol/L (ref 98–110)
Creat: 1.37 mg/dL — ABNORMAL HIGH (ref 0.60–1.29)
Globulin: 2.7 g/dL (calc) (ref 1.9–3.7)
Glucose, Bld: 121 mg/dL — ABNORMAL HIGH (ref 65–99)
Potassium: 4.4 mmol/L (ref 3.5–5.3)
Sodium: 140 mmol/L (ref 135–146)
Total Bilirubin: 0.8 mg/dL (ref 0.2–1.2)
Total Protein: 7.2 g/dL (ref 6.1–8.1)
eGFR: 65 mL/min/{1.73_m2} (ref >=60)

## 2023-09-16 LAB — HIV-1 RNA QUANT-NO REFLEX-BLD
HIV 1 RNA Quant: NOT DETECTED Copies/mL
HIV-1 RNA Quant, Log: NOT DETECTED Log cps/mL

## 2023-09-16 LAB — RPR: RPR Ser Ql: NONREACTIVE

## 2023-09-16 LAB — T-HELPER CELL (CD4) - (RCID CLINIC ONLY)
CD4 % Helper T Cell: 27 % — ABNORMAL LOW (ref 33–65)
CD4 T Cell Abs: 569 /uL (ref 400–1790)

## 2024-01-31 ENCOUNTER — Other Ambulatory Visit (HOSPITAL_COMMUNITY): Payer: Self-pay

## 2024-02-08 ENCOUNTER — Telehealth: Payer: Self-pay | Admitting: Internal Medicine

## 2024-02-08 NOTE — Telephone Encounter (Signed)
Spoke with patient regarding bleeding. States this has been going off and on for one month. Is not sure if this is from gums. Is worried its coming from back of mouth/ throat.  Has not seen a dentist in over a year. Provided him with contact info for dental coordinator to see if he is still active. If so recommended he schedule appt to see dentist.  Juanita Laster, RMA

## 2024-02-08 NOTE — Telephone Encounter (Signed)
Jose Carter called to confirm his next appointment. He would like to address concerns about his mouth randomly bleeding. He is scheduled 2/19 with [name]

## 2024-02-09 ENCOUNTER — Ambulatory Visit (INDEPENDENT_AMBULATORY_CARE_PROVIDER_SITE_OTHER): Payer: Self-pay | Admitting: Internal Medicine

## 2024-02-09 ENCOUNTER — Other Ambulatory Visit: Payer: Self-pay

## 2024-02-09 ENCOUNTER — Encounter: Payer: Self-pay | Admitting: Internal Medicine

## 2024-02-09 VITALS — BP 180/92 | HR 70 | Temp 97.4°F | Ht 73.0 in | Wt 257.0 lb

## 2024-02-09 DIAGNOSIS — Z113 Encounter for screening for infections with a predominantly sexual mode of transmission: Secondary | ICD-10-CM | POA: Diagnosis not present

## 2024-02-09 DIAGNOSIS — Z79899 Other long term (current) drug therapy: Secondary | ICD-10-CM

## 2024-02-09 DIAGNOSIS — Z860100 Personal history of colon polyps, unspecified: Secondary | ICD-10-CM

## 2024-02-09 DIAGNOSIS — R21 Rash and other nonspecific skin eruption: Secondary | ICD-10-CM | POA: Diagnosis not present

## 2024-02-09 DIAGNOSIS — B20 Human immunodeficiency virus [HIV] disease: Secondary | ICD-10-CM
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
## 2024-02-09 NOTE — Assessment & Plan Note (Signed)
He does have a mouth ulcer though no active bleeding noted.  I discussed supportive care for this and he will monitor if it worsens or continues bleeding and return for follow-up as indicated

## 2024-02-09 NOTE — Assessment & Plan Note (Signed)
Will screen today Will include anal Pap screening today

## 2024-02-09 NOTE — Assessment & Plan Note (Signed)
He recalls a history of having colon colon polyps that were removed and had recommended to him follow-up.  No records available but will refer to gastroenterology.

## 2024-02-09 NOTE — Assessment & Plan Note (Signed)
Continues to do well and previous labs reviewed with him.  Will check his blood again today and otherwise he can return in 6 months

## 2024-02-09 NOTE — Addendum Note (Signed)
Addended by: Gardiner Barefoot on: 02/09/2024 01:51 PM   Modules accepted: Orders

## 2024-02-10 LAB — CT/NG RNA, TMA RECTAL
Chlamydia Trachomatis RNA: DETECTED — AB
Neisseria Gonorrhoeae RNA: NOT DETECTED

## 2024-02-10 LAB — GC/CHLAMYDIA PROBE, AMP (THROAT)
Chlamydia trachomatis RNA: NOT DETECTED
Neisseria gonorrhoeae RNA: NOT DETECTED

## 2024-02-10 LAB — C. TRACHOMATIS/N. GONORRHOEAE RNA
C. trachomatis RNA, TMA: NOT DETECTED
N. gonorrhoeae RNA, TMA: NOT DETECTED

## 2024-02-11 ENCOUNTER — Other Ambulatory Visit: Payer: Self-pay | Admitting: Internal Medicine

## 2024-02-11 ENCOUNTER — Telehealth: Payer: Self-pay

## 2024-02-11 LAB — T-HELPER CELLS (CD4) COUNT (NOT AT ARMC)
Absolute CD4: 715 {cells}/uL (ref 490–1740)
CD4 T Helper %: 26 % — ABNORMAL LOW (ref 30–61)
Total lymphocyte count: 2798 {cells}/uL (ref 850–3900)

## 2024-02-11 LAB — LIPID PANEL
Cholesterol: 181 mg/dL (ref <200)
HDL: 38 mg/dL — ABNORMAL LOW (ref >=40)
LDL Cholesterol (Calc): 128 mg/dL — ABNORMAL HIGH
Non-HDL Cholesterol (Calc): 143 mg/dL — ABNORMAL HIGH (ref <130)
Total CHOL/HDL Ratio: 4.8 (calc) (ref <5.0)
Triglycerides: 61 mg/dL (ref <150)

## 2024-02-11 LAB — HIV-1 RNA QUANT-NO REFLEX-BLD
HIV 1 RNA Quant: <20 {copies}/mL — ABNORMAL HIGH
HIV-1 RNA Quant, Log: <1.3 {Log} — ABNORMAL HIGH

## 2024-02-11 LAB — RPR: RPR Ser Ql: NONREACTIVE

## 2024-02-11 MED ORDER — DOXYCYCLINE HYCLATE 100 MG PO TABS: 100.0000 mg | ORAL_TABLET | Freq: Two times a day (BID) | ORAL | 0 refills | Status: DC

## 2024-02-11 NOTE — Telephone Encounter (Signed)
Spoke with patient regarding results. Understands that since he tested positive for STD he will need to refrain from sexual activity until treatment is completed and wait additional ten days. Understands he will need to inform recent partners to get tested/treated.  No questions regarding treatment. Juanita Laster, RMA

## 2024-02-11 NOTE — Telephone Encounter (Signed)
-----   Message from Belia Heman [name] sent at 02/11/2024 11:24 AM EST ----- He is positive for chlamydia and doxycycline sent to his pharmacy thanks

## 2024-02-14 ENCOUNTER — Encounter: Payer: Self-pay | Admitting: Gastroenterology

## 2024-02-14 LAB — NON-GYN, SPECIMEN A

## 2024-02-14 LAB — CYTOLOGY - NON PAP

## 2024-02-16 ENCOUNTER — Encounter: Payer: Self-pay | Admitting: Gastroenterology

## 2024-02-17 ENCOUNTER — Ambulatory Visit (AMBULATORY_SURGERY_CENTER): Payer: Self-pay

## 2024-02-17 VITALS — Ht 73.0 in | Wt 250.0 lb

## 2024-02-17 DIAGNOSIS — Z860100 Personal history of colon polyps, unspecified: Secondary | ICD-10-CM

## 2024-02-17 NOTE — Progress Notes (Signed)
 No egg or soy allergy known to patient  No issues known to pt with past sedation with any surgeries or procedures Patient denies ever being told they had issues or difficulty with intubation  No FH of Malignant Hyperthermia Pt is on  OTC diet pills Pt is not on  home 02  Pt is not on blood thinners  Pt denies issues with constipation  No A fib or A flutter Have any cardiac testing pending--No Pt can ambulate  Pt denies use of chewing tobacco Discussed diabetic I weight loss medication holds Discussed NSAID holds Checked BMI Pt instructed to use [employer] or GoodRx for a price reduction on prep  Pre visit completed

## 2024-02-25 ENCOUNTER — Encounter (HOSPITAL_COMMUNITY): Payer: Self-pay

## 2024-02-25 ENCOUNTER — Emergency Department (HOSPITAL_COMMUNITY)

## 2024-02-25 ENCOUNTER — Other Ambulatory Visit: Payer: Self-pay

## 2024-02-25 ENCOUNTER — Ambulatory Visit (HOSPITAL_COMMUNITY)
Admission: EM | Admit: 2024-02-25 | Discharge: 2024-02-25 | Disposition: A | Discharge status: Home / Self Care | Attending: Family Medicine | Admitting: Family Medicine

## 2024-02-25 ENCOUNTER — Encounter: Payer: Self-pay | Admitting: Gastroenterology

## 2024-02-25 ENCOUNTER — Emergency Department (HOSPITAL_COMMUNITY)
Admission: EM | Admit: 2024-02-25 | Discharge: 2024-02-25 | Disposition: A | Discharge status: Home / Self Care | Attending: Emergency Medicine | Admitting: Emergency Medicine

## 2024-02-25 DIAGNOSIS — R42 Dizziness and giddiness: Secondary | ICD-10-CM

## 2024-02-25 DIAGNOSIS — R079 Chest pain, unspecified: Secondary | ICD-10-CM | POA: Diagnosis not present

## 2024-02-25 DIAGNOSIS — Z21 Asymptomatic human immunodeficiency virus [HIV] infection status: Secondary | ICD-10-CM | POA: Insufficient documentation

## 2024-02-25 DIAGNOSIS — R0789 Other chest pain: Secondary | ICD-10-CM | POA: Diagnosis not present

## 2024-02-25 LAB — CBC WITH DIFFERENTIAL/PLATELET
Abs Immature Granulocytes: 0.01 10*3/uL (ref 0.00–0.07)
Basophils Absolute: 0 10*3/uL (ref 0.0–0.1)
Basophils Relative: 1 %
Eosinophils Absolute: 0.1 10*3/uL (ref 0.0–0.5)
Eosinophils Relative: 2 %
HCT: 36.4 % — ABNORMAL LOW (ref 39.0–52.0)
Hemoglobin: 12.6 g/dL — ABNORMAL LOW (ref 13.0–17.0)
Immature Granulocytes: 0 %
Lymphocytes Relative: 39 %
Lymphs Abs: 2.2 10*3/uL (ref 0.7–4.0)
MCH: 28 pg (ref 26.0–34.0)
MCHC: 34.6 g/dL (ref 30.0–36.0)
MCV: 80.9 fL (ref 80.0–100.0)
Monocytes Absolute: 0.4 10*3/uL (ref 0.1–1.0)
Monocytes Relative: 8 %
Neutro Abs: 2.9 10*3/uL (ref 1.7–7.7)
Neutrophils Relative %: 50 %
Platelets: 224 10*3/uL (ref 150–400)
RBC: 4.5 MIL/uL (ref 4.22–5.81)
RDW: 13.9 % (ref 11.5–15.5)
WBC: 5.7 10*3/uL (ref 4.0–10.5)
nRBC: 0 % (ref 0.0–0.2)

## 2024-02-25 LAB — URINALYSIS, ROUTINE W REFLEX MICROSCOPIC
Bilirubin Urine: NEGATIVE
Glucose, UA: NEGATIVE mg/dL
Hgb urine dipstick: NEGATIVE
Ketones, ur: NEGATIVE mg/dL
Leukocytes,Ua: NEGATIVE
Nitrite: NEGATIVE
Protein, ur: NEGATIVE mg/dL
Specific Gravity, Urine: 1.019 (ref 1.005–1.030)
pH: 7 (ref 5.0–8.0)

## 2024-02-25 LAB — BASIC METABOLIC PANEL
Anion gap: 10 (ref 5–15)
BUN: 14 mg/dL (ref 6–20)
CO2: 22 mmol/L (ref 22–32)
Calcium: 8.8 mg/dL — ABNORMAL LOW (ref 8.9–10.3)
Chloride: 107 mmol/L (ref 98–111)
Creatinine, Ser: 1.47 mg/dL — ABNORMAL HIGH (ref 0.61–1.24)
GFR, Estimated: 59 mL/min — ABNORMAL LOW (ref >60)
Glucose, Bld: 89 mg/dL (ref 70–99)
Potassium: 4.1 mmol/L (ref 3.5–5.1)
Sodium: 139 mmol/L (ref 135–145)

## 2024-02-25 LAB — HEPATIC FUNCTION PANEL
ALT: 18 U/L (ref 0–44)
AST: 25 U/L (ref 15–41)
Albumin: 3.5 g/dL (ref 3.5–5.0)
Alkaline Phosphatase: 49 U/L (ref 38–126)
Bilirubin, Direct: 0.1 mg/dL (ref 0.0–0.2)
Indirect Bilirubin: 0.5 mg/dL (ref 0.3–0.9)
Total Bilirubin: 0.6 mg/dL (ref 0.0–1.2)
Total Protein: 6.2 g/dL — ABNORMAL LOW (ref 6.5–8.1)

## 2024-02-25 LAB — LIPASE, BLOOD: Lipase: 44 U/L (ref 11–51)

## 2024-02-25 LAB — TROPONIN I (HIGH SENSITIVITY)
Troponin I (High Sensitivity): 4 ng/L (ref <18)
Troponin I (High Sensitivity): 4 ng/L (ref <18)

## 2024-02-25 NOTE — ED Triage Notes (Signed)
 Pt BIB [facility] from urgent care with c/o CP that started 30 mins ago. 240 ASA given by [facility], VSS with BP 158/91. Pt denies SHOB, was able to stand and walk per [facility].

## 2024-02-25 NOTE — ED Triage Notes (Signed)
 Chest pain, dizziness, left arm numbness, started 30 minutes ago. Pain 8/10  EKG with some changes.

## 2024-02-25 NOTE — ED Provider Notes (Signed)
 [facility]    CSN: [id number] Arrival date & time: 02/25/24  1331      History   Chief Complaint Chief Complaint  Patient presents with   Nausea   Dizziness    HPI Jose Carter is a 48 y.o. male.    Dizziness Here for dizziness and chest tightness and his vision being dark.  The symptoms began about 30 minutes ago.  He has had some associated nausea with it.  He feels the nausea began after he ate a little something just about an hour ago.  He states that he got dizzy about 30 minutes ago and his vision was blurry and went very dark.  He now has developed some tightness in his chest and he is having some pain and tingling in his left arm.  He does take [employer].    Past Medical History:  Diagnosis Date   HIV infection [facility])     Patient Active Problem List   Diagnosis Date Noted   History of colon polyps 02/09/2024   Need for prophylactic vaccination and inoculation against influenza 09/14/2023   Transaminitis 03/16/2023   Sore throat 09/02/2022   Anxiety 07/21/2022   Pain and swelling of right knee 02/25/2022   Seasonal allergic conjunctivitis 06/13/2021   Frequency of urination and polyuria 10/28/2020   Weight gain 12/31/2017   Tobacco abuse 12/31/2017   Fatigue 08/04/2017   Screening examination for venereal disease 11/22/2014   Encounter for long-term (current) use of medications 11/22/2014   Rash and nonspecific skin eruption 11/22/2014   Substance abuse (HCC) 04/11/2013   Epididymitis, left 02/21/2013   Renal insufficiency 08/25/2012   Bell's palsy 08/11/2012   HIV 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 Authorizing Provider  bictegravir-emtricitabine-tenofovir AF (BIKTARVY) 50-200-25 MG TABS tablet Take 1 tablet by mouth  daily. 09/14/23  Yes [name], Belia Heman, MD  doxycycline (VIBRA-TABS) 100 MG tablet Take 1 tablet (100 mg total) by mouth 2 (two) times daily. 02/11/24   Gardiner Barefoot, MD  fluticasone (FLONASE) 50 MCG/ACT nasal spray Place into both nostrils daily.    [provider]  OVER THE COUNTER MEDICATION Takes intek revolution and berbertrim supplements   Yes [provider]    Family History Family History  Problem Relation Age of Onset   Colon cancer Neg Hx    Rectal cancer Neg Hx    Stomach cancer Neg Hx    Esophageal cancer Neg Hx     Social History Social History   Tobacco Use   Smoking status: Former    Current packs/day: 0.00    Average packs/day: 0.5 packs/day for 6.9 years (3.5 ttl pk-yrs)    Types: Cigarettes    Start date: 01/10/2013    Quit date: 12/22/2019    Years since quitting: 4.1   Smokeless tobacco: Never  Vaping Use   Vaping status: Never Used  Substance Use Topics   Alcohol use: Yes    Comment: wine   Drug use: Not Currently    Types: Cocaine, Marijuana    Comment: 2-3 times a week; 56 days drug/alcohol free 10/28/20     Allergies   Sustiva [efavirenz]   Review of Systems Review of Systems  Neurological:  Positive for dizziness.     Physical Exam Triage Vital Signs ED Triage Vitals [02/25/24 1352]  Encounter Vitals Group     BP (!) 141/97     Systolic BP Percentile      Diastolic BP Percentile      Pulse Rate 92     Resp 16     Temp 97.8 F (36.6 C)     Temp Source Oral     SpO2 98 %     Weight      Height      Head Circumference      Peak Flow      Pain Score      Pain Loc      Pain Education      Exclude from Growth Chart    No data found.  Updated Vital Signs BP (!) 141/97 (BP Location: Left Arm)   Pulse 92   Temp 97.8 F (36.6 C) (Oral)   Resp 16   SpO2 98%   Visual Acuity Right Eye Distance:   Left Eye Distance:   Bilateral Distance:    Right Eye Near:   Left Eye Near:    Bilateral Near:     Physical  Exam Vitals reviewed.  Constitutional:      Appearance: He is not toxic-appearing or diaphoretic.     Comments: No acute respiratory distress  HENT:     Mouth/Throat:     Mouth: Mucous membranes are moist.  Eyes:     Extraocular Movements: Extraocular movements intact.     Pupils: Pupils are equal, round, and reactive to light.  Cardiovascular:     Rate and Rhythm: Normal rate and regular rhythm.  Pulmonary:     Effort: Pulmonary effort is normal.     Breath sounds: Normal breath sounds.  Chest:     Chest wall: No tenderness.  Musculoskeletal:     Cervical back: Neck supple.  Lymphadenopathy:     Cervical: No cervical adenopathy.  Skin:    Coloration: Skin is not jaundiced or pale.  Neurological:     General: No focal deficit present.     Mental Status: He is alert and oriented to person, place, and time.     Coordination: Abnormal coordination: .urg.  Psychiatric:        Behavior: Behavior normal.      UC Treatments / Results  Labs (all labs ordered are listed, but only abnormal results are displayed) Labs Reviewed - No data to display  EKG   Radiology No results found.  Procedures Procedures (including critical care time)  Medications Ordered in UC Medications - No data to display  Initial Impression / Assessment and Plan / UC Course  I have reviewed the triage vital signs and the nursing notes.  Pertinent labs & imaging results that were available during my care of the patient were reviewed by me and considered in my medical decision making (see chart for details).      EKG shows sinus rhythm.  There are no definite ST segment elevations but there are some T wave abnormalities that I think are concerning.  I think he needs to be evaluated in the ER for further.  He had driven himself here and I do not think he is safe to be driving or to walk across to the emergency room.  [facility] does not have a truck, so we have called 911 to transport  him.   Final Clinical Impressions(s) / UC Diagnoses   Final  diagnoses:  None   Discharge Instructions   None    ED Prescriptions   None    PDMP not reviewed this encounter.   Zenia Resides, MD 02/25/24 1357

## 2024-02-25 NOTE — ED Notes (Signed)
 Patient is resting comfortably. I was advised to call [facility] for transport. No trucks available.Per Dr.Banister we should call EMS.

## 2024-02-25 NOTE — ED Provider Notes (Signed)
 [place] EMERGENCY DEPARTMENT AT [facility] Provider Note   CSN: [id number] Arrival date & time: 02/25/24  1427     History  Chief Complaint  Patient presents with   Chest Pain    Jose Carter is a 48 y.o. male.  Patient here after episode of lightheadedness dizziness like he was in a pass out but did not have some chest pain quickly resolved.  He is not having any symptoms now.  History of HIV compliant with his medications.  Denies any alcohol drug use.  He states that he is sitting at his desk at work got up to walk across the warehouse to go to the bathroom got lightheaded dizzy.  Denies any nausea vomiting diarrhea.  Denies any recent illness.  No recent surgery or travel.  No major cardiac history in the family.  No blood clot history.  The history is provided by the patient.       Home Medications Prior to Admission medications   Medication Sig Start Date End Date Taking? Authorizing Provider  bictegravir-emtricitabine-tenofovir AF (BIKTARVY) 50-200-25 MG TABS tablet Take 1 tablet by mouth daily. 09/14/23   [name], Belia Heman, MD  OVER THE COUNTER MEDICATION Takes intek revolution and berbertrim supplements    [provider]      Allergies    Sustiva [efavirenz]    Review of Systems   Review of Systems  Physical Exam Updated Vital Signs BP 135/75 (BP Location: Right Arm)   Pulse 62   Temp 97.7 F (36.5 C) (Oral)   Resp 18   SpO2 99%  Physical Exam Vitals and nursing note reviewed.  Constitutional:      General: He is not in acute distress.    Appearance: He is well-developed. He is not ill-appearing.  HENT:     Head: Normocephalic and atraumatic.  Eyes:     Extraocular Movements: Extraocular movements intact.     Conjunctiva/sclera: Conjunctivae normal.     Pupils: Pupils are equal, round, and reactive to light.  Cardiovascular:     Rate and Rhythm: Normal rate and regular rhythm.     Pulses:          Radial pulses are 2+ on the  right side and 2+ on the left side.     Heart sounds: Normal heart sounds. No murmur heard. Pulmonary:     Effort: Pulmonary effort is normal. No respiratory distress.     Breath sounds: Normal breath sounds.  Abdominal:     Palpations: Abdomen is soft.     Tenderness: There is no abdominal tenderness.  Musculoskeletal:        General: No swelling. Normal range of motion.     Cervical back: Normal range of motion and neck supple.     Right lower leg: No edema.     Left lower leg: No edema.  Skin:    General: Skin is warm and dry.     Capillary Refill: Capillary refill takes less than 2 seconds.  Neurological:     General: No focal deficit present.     Mental Status: He is alert and oriented to person, place, and time.     Cranial Nerves: No cranial nerve deficit.     Motor: No weakness.  Psychiatric:        Mood and Affect: Mood normal.     ED Results / Procedures / Treatments   Labs (all labs ordered are listed, but only abnormal results are displayed) Labs Reviewed  CBC WITH DIFFERENTIAL/PLATELET - Abnormal; Notable for the following components:      Result Value   Hemoglobin 12.6 (*)    HCT 36.4 (*)    All other components within normal limits  BASIC METABOLIC PANEL - Abnormal; Notable for the following components:   Creatinine, Ser 1.47 (*)    Calcium 8.8 (*)    GFR, Estimated 59 (*)    All other components within normal limits  HEPATIC FUNCTION PANEL - Abnormal; Notable for the following components:   Total Protein 6.2 (*)    All other components within normal limits  URINALYSIS, ROUTINE W REFLEX MICROSCOPIC  LIPASE, BLOOD  TROPONIN I (HIGH SENSITIVITY)  TROPONIN I (HIGH SENSITIVITY)    EKG EKG Interpretation Date/Time:  Friday February 25 2024 14:42:44 EST Ventricular Rate:  62 PR Interval:  202 QRS Duration:  92 QT Interval:  386 QTC Calculation: 391 R Axis:   73  Text Interpretation: Normal sinus rhythm Normal ECG When compared with ECG of 19-Aug-2003  00:20, PREVIOUS ECG IS PRESENT Confirmed by Virgina Norfolk [phone]) on 02/25/2024 2:56:59 PM  Radiology DG Chest 2 View Result Date: 02/25/2024 CLINICAL DATA:  Chest pain. EXAM: CHEST - 2 VIEW COMPARISON:  None available currently. FINDINGS: The heart size and mediastinal contours are within normal limits. Both lungs are clear. The visualized skeletal structures are unremarkable. IMPRESSION: No active cardiopulmonary disease. Electronically Signed   By: Lupita Raider M.D.   On: 02/25/2024 18:41    Procedures Procedures    Medications Ordered in ED Medications - No data to display  ED Course/ Medical Decision Making/ A&P                                 Medical Decision Making Amount and/or Complexity of Data Reviewed Labs: ordered.   Jose Carter is here after what sounds like a near syncopal event chest pain.  Normal vitals.  No fever.  EKG shows sinus rhythm.  No ischemic changes.  He has no symptoms now.  Very well-appearing.  He is PERC negative and doubt PE.  Does not really have any cardiac risk factors history of HIV compliant with his meds.  Denies any fever chills cough or sputum production.  Heart score is 1.  Will check CBC BMP hepatic function panel troponin chest x-ray and reevaluate.  My suspicion differential diagnosis likely vasovagal type event orthostatic event but will rule out ACS electrolyte abnormality anemia infectious process.  Concern for stroke.  Patient per my review interpretation of labs has no significant anemia electrolyte abnormality kidney injury or leukocytosis.  Troponin negative.  Chest x-ray without evidence of pneumonia pneumothorax.  I have no concern for dissection or PE.  He is asymptomatic.  Discharged in good condition.  Recommend follow-up with primary care doctor.  Discharge.  Overall suspect vasovagal/orthostatic type event today.  This chart was dictated using voice recognition software.  Despite best efforts to proofread,  errors can occur  which can change the documentation meaning.         Final Clinical Impression(s) / ED Diagnoses Final diagnoses:  Nonspecific chest pain    Rx / DC Orders ED Discharge Orders     None         Virgina Norfolk, DO 02/25/24 1856

## 2024-02-25 NOTE — ED Notes (Signed)
Assumed pt care from Sara RN

## 2024-02-25 NOTE — ED Notes (Signed)
 Report given to [place], CN at [facility].

## 2024-02-25 NOTE — Discharge Instructions (Signed)
 He will go by transport to the ED for further evaluation

## 2024-02-25 NOTE — ED Notes (Signed)
 Removed 20g IV from pt left hand.

## 2024-03-01 ENCOUNTER — Telehealth: Payer: Self-pay | Admitting: Gastroenterology

## 2024-03-01 NOTE — Telephone Encounter (Signed)
 Spoke with pt. Ok to proceed. Pt encouraged to increase fluid intake over the next few days and avoid any nuts from this point on.

## 2024-03-01 NOTE — Telephone Encounter (Signed)
 Inbound call from patient stating he ate cans of peanuts on Sunday 3/10 and Monday 3/11. Requesting a call to ensure it is okay to proceed with 3/14 colonoscopy. Please advise, thank you.

## 2024-03-03 ENCOUNTER — Ambulatory Visit: Payer: Self-pay | Admitting: Gastroenterology

## 2024-03-03 ENCOUNTER — Encounter: Payer: Self-pay | Admitting: Gastroenterology

## 2024-03-03 VITALS — BP 100/70 | HR 69 | Temp 98.3°F | Resp 8 | Ht 73.0 in | Wt 250.0 lb

## 2024-03-03 DIAGNOSIS — Z860100 Personal history of colon polyps, unspecified: Secondary | ICD-10-CM

## 2024-03-03 DIAGNOSIS — K640 First degree hemorrhoids: Secondary | ICD-10-CM

## 2024-03-03 DIAGNOSIS — Z1211 Encounter for screening for malignant neoplasm of colon: Secondary | ICD-10-CM | POA: Diagnosis not present

## 2024-03-03 MED ORDER — SODIUM CHLORIDE 0.9 % IV SOLN: 500.0000 mL | INTRAVENOUS | Status: DC

## 2024-03-03 NOTE — Patient Instructions (Signed)
 Resume previous diet Continue present medications  Handouts/information given for fibercon tablets, high fiber diet and hemorrhoids  There were no colon polyps seen today!  You will need another screening colonoscopy in 10 years, you will receive a letter at that time when you are due for the procedure.   Please call us at [phone] if you have a change in bowel habits, change in family history of colo-rectal cancer, rectal bleeding or other GI concern before that time.  YOU HAD AN ENDOSCOPIC PROCEDURE TODAY AT THE [facility]:   Refer to the procedure report that was given to you for any specific questions about what was found during the examination.  If the procedure report does not answer your questions, please call your gastroenterologist to clarify.  If you requested that your care partner not be given the details of your procedure findings, then the procedure report has been included in a sealed envelope for you to review at your convenience later.  YOU SHOULD EXPECT: Some feelings of bloating in the abdomen. Passage of more gas than usual.  Walking can help get rid of the air that was put into your GI tract during the procedure and reduce the bloating. If you had a lower endoscopy (such as a colonoscopy or flexible sigmoidoscopy) you may notice spotting of blood in your stool or on the toilet paper. If you underwent a bowel prep for your procedure, you may not have a normal bowel movement for a few days.  Please Note:  You might notice some irritation and congestion in your nose or some drainage.  This is from the oxygen used during your procedure.  There is no need for concern and it should clear up in a day or so.  SYMPTOMS TO REPORT IMMEDIATELY:  Following lower endoscopy (colonoscopy):  Excessive amounts of blood in the stool  Significant tenderness or worsening of abdominal pains  Swelling of the abdomen that is new, acute  Fever of 100F or higher  For urgent or  emergent issues, a gastroenterologist can be reached at any hour by calling [phone]. Do not use MyChart messaging for urgent concerns.   DIET:  We do recommend a small meal at first, but then you may proceed to your regular diet.  Drink plenty of fluids but you should avoid alcoholic beverages for 24 hours.  ACTIVITY:  You should plan to take it easy for the rest of today and you should NOT DRIVE or use heavy machinery until tomorrow (because of the sedation medicines used during the test).    FOLLOW UP: Our staff will call the number listed on your records the next business day following your procedure.  We will call around 7:15- 8:00 am to check on you and address any questions or concerns that you may have regarding the information given to you following your procedure. If we do not reach you, we will leave a message.     SIGNATURES/CONFIDENTIALITY: You and/or your care partner have signed paperwork which will be entered into your electronic medical record.  These signatures attest to the fact that that the information above on your After Visit Summary has been reviewed and is understood.  Full responsibility of the confidentiality of this discharge information lies with you and/or your care-partner.

## 2024-03-03 NOTE — Progress Notes (Signed)
 Vss nad trans to pacu

## 2024-03-03 NOTE — Progress Notes (Signed)
Pt states no changes to health hx since previsit

## 2024-03-03 NOTE — Op Note (Signed)
 [facility] Patient Name: Jose Carter Procedure Date: 03/03/2024 2:24 PM MRN: [id number] Endoscopist: Corliss Parish , MD, [id number] Age: 48 Referring MD:  Date of Birth: 13-Apr-1976 Gender: Male Account #: [id number] Procedure:                Colonoscopy Indications:              Screening for colorectal malignant neoplasm Medicines:                Monitored Anesthesia Care Procedure:                Pre-Anesthesia Assessment:                           - Prior to the procedure, a History and Physical                            was performed, and patient medications and                            allergies were reviewed. The patient's tolerance of                            previous anesthesia was also reviewed. The risks                            and benefits of the procedure and the sedation                            options and risks were discussed with the patient.                            All questions were answered, and informed consent                            was obtained. Prior Anticoagulants: The patient has                            taken no anticoagulant or antiplatelet agents. ASA                            Grade Assessment: II - A patient with mild systemic                            disease. After reviewing the risks and benefits,                            the patient was deemed in satisfactory condition to                            undergo the procedure.                           After obtaining informed consent, the colonoscope  was passed under direct vision. Throughout the                            procedure, the patient's blood pressure, pulse, and                            oxygen saturations were monitored continuously. The                            CF [id number] #[id number] was introduced through the anus                            and advanced to the 3 cm into the ileum. The                            colonoscopy was  performed without difficulty. The                            patient tolerated the procedure. The quality of the                            bowel preparation was good. The terminal ileum,                            ileocecal valve, appendiceal orifice, and rectum                            were photographed. Scope In: 2:38:04 PM Scope Out: 2:48:53 PM Scope Withdrawal Time: 0 hours 7 minutes 46 seconds  Total Procedure Duration: 0 hours 10 minutes 49 seconds  Findings:                 The digital rectal exam was normal. Pertinent                            negatives include no palpable rectal lesions.                           The terminal ileum and ileocecal valve appeared                            normal.                           Normal mucosa was found in the entire colon.                           Non-bleeding non-thrombosed internal hemorrhoids                            were found during retroflexion, during perianal                            exam and during digital exam. The hemorrhoids were  Grade I (internal hemorrhoids that do not prolapse). Complications:            No immediate complications. Estimated Blood Loss:     Estimated blood loss: none. Impression:               - The examined portion of the ileum was normal.                           - Normal mucosa in the entire examined colon.                           - Non-bleeding non-thrombosed internal hemorrhoids. Recommendation:           - The patient will be observed post-procedure,                            until all discharge criteria are met.                           - Discharge patient to home.                           - Patient has a contact number available for                            emergencies. The signs and symptoms of potential                            delayed complications were discussed with the                            patient. Return to normal activities tomorrow.                             Written discharge instructions were provided to the                            patient.                           - High fiber diet.                           - Use FiberCon 1-2 tablets PO daily.                           - Continue present medications.                           - Repeat colonoscopy in 10 years for screening                            purposes.                           - The findings and recommendations were discussed  with the patient. Corliss Parish, MD 03/03/2024 2:53:42 PM

## 2024-03-03 NOTE — Progress Notes (Signed)
 GASTROENTEROLOGY PROCEDURE H&P NOTE   Primary Care Physician: Pcp, No  HPI: Jose Carter is a 48 y.o. male who presents for Colonoscopy for screening.  Past Medical History:  Diagnosis Date   HIV infection [facility])    Past Surgical History:  Procedure Laterality Date   COLONOSCOPY     2008 or 2009 per pt   Current Outpatient Medications  Medication Sig Dispense Refill   bictegravir-emtricitabine-tenofovir AF (BIKTARVY) 50-200-25 MG TABS tablet Take 1 tablet by mouth daily. 30 tablet 11   OVER THE COUNTER MEDICATION Takes intek revolution and berbertrim supplements     Current Facility-Administered Medications  Medication Dose Route Frequency Provider Last Rate Last Admin   0.9 %  sodium chloride infusion  500 mL Intravenous Continuous Mansouraty, Netty Starring., MD        Current Outpatient Medications:    bictegravir-emtricitabine-tenofovir AF (BIKTARVY) 50-200-25 MG TABS tablet, Take 1 tablet by mouth daily., Disp: 30 tablet, Rfl: 11   OVER THE COUNTER MEDICATION, Takes intek revolution and berbertrim supplements, Disp: , Rfl:   Current Facility-Administered Medications:    0.9 %  sodium chloride infusion, 500 mL, Intravenous, Continuous, Mansouraty, Netty Starring., MD Allergies  Allergen Reactions   Sustiva [Efavirenz] Swelling    Pt states he was told by prison physician to list Sustiva as an allergy.    Family History  Problem Relation Age of Onset   Colon cancer Neg Hx    Rectal cancer Neg Hx    Stomach cancer Neg Hx    Esophageal cancer Neg Hx    Social History   Socioeconomic History   Marital status: Single    Spouse name: Not on file   Number of children: Not on file   Years of education: Not on file   Highest education level: Not on file  Occupational History   Not on file  Tobacco Use   Smoking status: Former    Current packs/day: 0.00    Average packs/day: 0.5 packs/day for 6.9 years (3.5 ttl pk-yrs)    Types: Cigarettes    Start date: 01/10/2013     Quit date: 12/22/2019    Years since quitting: 4.2   Smokeless tobacco: Never  Vaping Use   Vaping status: Never Used  Substance and Sexual Activity   Alcohol use: Yes    Comment: wine   Drug use: Not Currently    Types: Cocaine, Marijuana    Comment: 2-3 times a week; 56 days drug/alcohol free 10/28/20   Sexual activity: Not Currently    Partners: Male    Birth control/protection: Condom    Comment: declined condoms  Other Topics Concern   Not on file  Social History Narrative   Not on file   Social Drivers of Health   Financial Resource Strain: Not on file  Food Insecurity: Not on file  Transportation Needs: Not on file  Physical Activity: Not on file  Stress: Not on file  Social Connections: Not on file  Intimate Partner Violence: Not on file    Physical Exam: Today's Vitals   03/03/24 1404  BP: (!) 147/84  Pulse: 70  Temp: 98.3 F (36.8 C)  SpO2: 98%  Weight: 250 lb (113.4 kg)  Height: 6\' 1"  (1.854 m)   Body mass index is 32.98 kg/m. GEN: NAD EYE: Sclerae anicteric ENT: MMM CV: Non-tachycardic GI: Soft, NT/ND NEURO:  Alert & Oriented x 3  Lab Results: No results for input(s): "WBC", "HGB", "HCT", "PLT" in the last 72  hours. BMET No results for input(s): "NA", "K", "CL", "CO2", "GLUCOSE", "BUN", "CREATININE", "CALCIUM" in the last 72 hours. LFT No results for input(s): "PROT", "ALBUMIN", "AST", "ALT", "ALKPHOS", "BILITOT", "BILIDIR", "IBILI" in the last 72 hours. PT/INR No results for input(s): "LABPROT", "INR" in the last 72 hours.   Impression / Plan: This is a 48 y.o.male who presents for Colonoscopy for screening.  The risks and benefits of endoscopic evaluation/treatment were discussed with the patient and/or family; these include but are not limited to the risk of perforation, infection, bleeding, missed lesions, lack of diagnosis, severe illness requiring hospitalization, as well as anesthesia and sedation related illnesses.  The patient's  history has been reviewed, patient examined, no change in status, and deemed stable for procedure.  The patient and/or family is agreeable to proceed.    Corliss Parish, MD [facility] Office # [phone]

## 2024-03-06 ENCOUNTER — Telehealth: Payer: Self-pay | Admitting: *Deleted

## 2024-03-06 NOTE — Telephone Encounter (Signed)
  Follow up Call-     03/03/2024    2:07 PM  Call back number  Post procedure Call Back phone  # [phone]  Permission to leave phone message Yes     Patient questions:  Do you have a fever, pain , or abdominal swelling? No. Pain Score  0 *  Have you tolerated food without any problems? Yes.    Have you been able to return to your normal activities? Yes.    Do you have any questions about your discharge instructions: Diet   No. Medications  No. Follow up visit  No.  Do you have questions or concerns about your Care? No.  Actions: * If pain score is 4 or above: No action needed, pain <4.

## 2024-03-15 ENCOUNTER — Ambulatory Visit: Payer: Self-pay | Admitting: Internal Medicine

## 2024-03-16 ENCOUNTER — Ambulatory Visit: Payer: Self-pay | Admitting: Internal Medicine

## 2024-04-06 ENCOUNTER — Other Ambulatory Visit: Payer: Self-pay | Admitting: Internal Medicine

## 2024-04-12 ENCOUNTER — Ambulatory Visit: Payer: Self-pay | Admitting: Internal Medicine

## 2024-05-04 NOTE — Progress Notes (Signed)
 The 10-year ASCVD risk score (Arnett DK, et al., 2019) is: 3%   Values used to calculate the score:     Age: 48 years     Sex: Male     Is Non-Hispanic African American: Yes     Diabetic: No     Tobacco smoker: No     Systolic Blood Pressure: 100 mmHg     Is BP treated: No     HDL Cholesterol: 38 mg/dL     Total Cholesterol: 181 mg/dL  Arlon Bergamo, BSN, RN

## 2024-05-09 ENCOUNTER — Encounter: Payer: Self-pay | Admitting: Infectious Disease

## 2024-05-09 DIAGNOSIS — E785 Hyperlipidemia, unspecified: Secondary | ICD-10-CM | POA: Insufficient documentation

## 2024-05-09 HISTORY — DX: Hyperlipidemia, unspecified: E78.5

## 2024-05-10 ENCOUNTER — Other Ambulatory Visit: Payer: Self-pay

## 2024-05-10 ENCOUNTER — Encounter: Payer: Self-pay | Admitting: Infectious Disease

## 2024-05-10 ENCOUNTER — Telehealth: Payer: Self-pay | Admitting: Pharmacist

## 2024-05-10 ENCOUNTER — Ambulatory Visit (INDEPENDENT_AMBULATORY_CARE_PROVIDER_SITE_OTHER): Payer: Self-pay | Admitting: Infectious Disease

## 2024-05-10 ENCOUNTER — Telehealth: Payer: Self-pay

## 2024-05-10 VITALS — BP 119/77 | HR 78 | Temp 97.3°F | Ht 73.0 in | Wt 261.0 lb

## 2024-05-10 DIAGNOSIS — E785 Hyperlipidemia, unspecified: Secondary | ICD-10-CM | POA: Diagnosis not present

## 2024-05-10 DIAGNOSIS — R3589 Other polyuria: Secondary | ICD-10-CM

## 2024-05-10 DIAGNOSIS — B20 Human immunodeficiency virus [HIV] disease: Secondary | ICD-10-CM | POA: Diagnosis not present

## 2024-05-10 DIAGNOSIS — Z8619 Personal history of other infectious and parasitic diseases: Secondary | ICD-10-CM | POA: Diagnosis not present

## 2024-05-10 DIAGNOSIS — Z113 Encounter for screening for infections with a predominantly sexual mode of transmission: Secondary | ICD-10-CM | POA: Diagnosis not present

## 2024-05-10 DIAGNOSIS — R030 Elevated blood-pressure reading, without diagnosis of hypertension: Secondary | ICD-10-CM

## 2024-05-10 MED ORDER — BICTEGRAVIR-EMTRICITAB-TENOFOV 50-200-25 MG PO TABS
1.0000 | ORAL_TABLET | Freq: Every day | ORAL | 11 refills | Status: AC
Start: 2024-05-10 — End: ?

## 2024-05-10 NOTE — Telephone Encounter (Signed)
Error Jose Carter, RMA  

## 2024-05-10 NOTE — Telephone Encounter (Signed)
 Counseled Jose Carter on Cabenuva injections in clinic today. He is very excited about the long-acting injectable option. Explained that there is risk of developing resistance if he doesn't consistently come in for his injections on time. He verbalized his understanding. Also discussed the dosing window and potential for soreness and nodules after the first few doses of the medication. Counseled that he can take acetaminophen  or ibuprofen  if needed. Also explained that the first 2 doses will be 1 month apart, then transition to every 2 months afterwards. He signed the Guinea contract during this visit.  Will need to complete a PA for his medication. Will schedule him for his first injection once insurance authorization has been completed.  Valarie Garner, PharmD PGY1 Pharmacy Resident

## 2024-05-10 NOTE — Progress Notes (Signed)
 Subjective:  Chief complaint: follow-up for HIV disease on medications   Patient ID: Jose Carter, male    DOB: Mar 30, 1976, 48 y.o.   MRN: [id number]  HPI  Discussed the use of AI scribe software for clinical note transcription with the patient, who gave verbal consent to proceed.  Discussed the use of AI scribe software for clinical note transcription with the patient, who gave verbal consent to proceed.  History of Present Illness   Jose Carter is a 48 year old male with HIV who presents for a consultation regarding switching to Cabenuva.  He has been living with HIV since 1999 and is currently on Biktarvy with an undetectable viral load. A recent GenaSure archive test was performed to assess for any resistance. He has a history of chlamydia detected during a rectal test in March, with no current symptoms or recent rectal sexual activity. He is not interested in retesting for sexually transmitted infections at this time.       Past Medical History:  Diagnosis Date   HIV infection (HCC)    Hyperlipidemia 05/09/2024    Past Surgical History:  Procedure Laterality Date   COLONOSCOPY     2008 or 2009 per pt    Family History  Problem Relation Age of Onset   Colon cancer Neg Hx    Rectal cancer Neg Hx    Stomach cancer Neg Hx    Esophageal cancer Neg Hx       Social History   Socioeconomic History   Marital status: Single    Spouse name: Not on file   Number of children: Not on file   Years of education: Not on file   Highest education level: Not on file  Occupational History   Not on file  Tobacco Use   Smoking status: Former    Current packs/day: 0.00    Average packs/day: 0.5 packs/day for 6.9 years (3.5 ttl pk-yrs)    Types: Cigarettes    Start date: 01/10/2013    Quit date: 12/22/2019    Years since quitting: 4.3   Smokeless tobacco: Never  Vaping Use   Vaping status: Never Used  Substance and Sexual Activity   Alcohol use: Yes    Comment: wine    Drug use: Not Currently    Types: Cocaine, Marijuana    Comment: 2-3 times a week; 56 days drug/alcohol free 10/28/20   Sexual activity: Not Currently    Partners: Male    Birth control/protection: Condom    Comment: ACCEPTED CONDOMS  Other Topics Concern   Not on file  Social History Narrative   Not on file   Social Drivers of Health   Financial Resource Strain: Not on file  Food Insecurity: Not on file  Transportation Needs: Not on file  Physical Activity: Not on file  Stress: Not on file  Social Connections: Not on file    Allergies  Allergen Reactions   Sustiva [Efavirenz] Swelling    Pt states he was told by prison physician to list Sustiva as an allergy.      Current Outpatient Medications:    OVER THE COUNTER MEDICATION, Takes intek revolution and berbertrim supplements, Disp: , Rfl:    bictegravir-emtricitabine -tenofovir  AF (BIKTARVY) 50-200-25 MG TABS tablet, Take 1 tablet by mouth daily., Disp: 30 tablet, Rfl: 11   Review of Systems  Constitutional:  Negative for activity change, appetite change, chills, diaphoresis, fatigue, fever and unexpected weight change.  HENT:  Negative for congestion, rhinorrhea, sinus  pressure, sneezing, sore throat and trouble swallowing.   Eyes:  Negative for photophobia and visual disturbance.  Respiratory:  Negative for cough, chest tightness, shortness of breath, wheezing and stridor.   Cardiovascular:  Negative for chest pain, palpitations and leg swelling.  Gastrointestinal:  Negative for abdominal distention, abdominal pain, anal bleeding, blood in stool, constipation, diarrhea, nausea and vomiting.  Endocrine: Positive for polyuria.  Genitourinary:  Positive for urgency. Negative for difficulty urinating, dysuria, flank pain and hematuria.  Musculoskeletal:  Negative for arthralgias, back pain, gait problem, joint swelling and myalgias.  Skin:  Negative for color change, pallor, rash and wound.  Neurological:  Negative for  dizziness, tremors, weakness and light-headedness.  Hematological:  Negative for adenopathy. Does not bruise/bleed easily.  Psychiatric/Behavioral:  Negative for agitation, behavioral problems, confusion, decreased concentration, dysphoric mood and sleep disturbance.        Objective:   Physical Exam Constitutional:      Appearance: He is well-developed.  HENT:     Head: Normocephalic and atraumatic.  Eyes:     Conjunctiva/sclera: Conjunctivae normal.  Cardiovascular:     Rate and Rhythm: Normal rate and regular rhythm.  Pulmonary:     Effort: Pulmonary effort is normal. No respiratory distress.     Breath sounds: No wheezing.  Abdominal:     General: There is no distension.     Palpations: Abdomen is soft.  Musculoskeletal:        General: No tenderness. Normal range of motion.     Cervical back: Normal range of motion and neck supple.  Skin:    General: Skin is warm and dry.     Coloration: Skin is not pale.     Findings: No erythema or rash.  Neurological:     General: No focal deficit present.     Mental Status: He is alert and oriented to person, place, and time.  Psychiatric:        Mood and Affect: Mood normal.        Behavior: Behavior normal.        Thought Content: Thought content normal.        Judgment: Judgment normal.           Assessment & Plan:   Assessment and Plan    HIV infection, undetectable viral load HIV infection with undetectable viral load. He is on [place] and considering switching to Cabenuva. Discussed Cabenuva's risks and benefits, including timely injections and a 1% risk of resistance even if on time injections, which could limit future use of Biktarvy and necessitate alternatives like Symtuza. Genosure found and he has zero RPV R Pharmacy to meet w him and have him sign contract and coordinate his first dose and 2nd dose I will defer scheduling with me until we have timingo n his Cabenuva continue Biktarvy in the  interim   Chlamydia infection Chlamydia infection detected via rectal test. He reports no current symptoms and no recent rectal sex.  Hypertension, unspecified Hypertension, currently not on medication. Blood pressure was normal during this visit, which is rare. Discussed accurate blood pressure monitoring and potential need for medication if consistently high readings are observed. - Recommend purchasing a properly fitting blood pressure cuff for home monitoring. - Advise home blood pressure monitoring and reporting of readings.   Polyuria: I will check A1c, PSA, UA and culture in case this is due to prostatitis or newly diagnosed DM   Hyperlipidemia: discussed REPRIEVE data and indication for statin. He will think about  this and consider this at next visit.   I also reviewed [employer] study but he did not want to participate in a clinical trial at this point      HIV disease:  I have reviewed [facility] labs including viral load which was  Lab Results  Component Value Date   HIV1RNAQUANT <20 (H) 02/09/2024   and cd4 which was  Lab Results  Component Value Date   CD4TABS 569 09/14/2023

## 2024-05-11 ENCOUNTER — Telehealth: Payer: Self-pay

## 2024-05-11 ENCOUNTER — Other Ambulatory Visit (HOSPITAL_COMMUNITY): Payer: Self-pay

## 2024-05-11 ENCOUNTER — Ambulatory Visit: Payer: Self-pay

## 2024-05-11 LAB — URINALYSIS, ROUTINE W REFLEX MICROSCOPIC
Bilirubin Urine: NEGATIVE
Glucose, UA: NEGATIVE
Hgb urine dipstick: NEGATIVE
Leukocytes,Ua: NEGATIVE
Nitrite: NEGATIVE
Protein, ur: NEGATIVE
Specific Gravity, Urine: 1.028 (ref 1.001–1.035)
pH: 5.5 (ref 5.0–8.0)

## 2024-05-11 LAB — HEMOGLOBIN A1C
Hgb A1c MFr Bld: 5.5 % (ref <5.7)
Mean Plasma Glucose: 111 mg/dL
eAG (mmol/L): 6.2 mmol/L

## 2024-05-11 LAB — PSA: PSA: 0.44 ng/mL (ref <=4.00)

## 2024-05-11 LAB — URINE CULTURE
MICRO NUMBER:: 16486907
Result:: NO GROWTH
SPECIMEN QUALITY:: ADEQUATE

## 2024-05-11 NOTE — Telephone Encounter (Signed)
 Pharmacy Patient Advocate Encounter- Jose Carter BIV-Medical Benefit:  J code: [id number]  CPT code: 60454  Dx Code: B20  NO PA is required through [facility]. Authorization# [id number]  Patient will have a $40.00 office copay  Notice will be in Media  Patient is enrolled with [employer]

## 2024-05-18 ENCOUNTER — Telehealth: Payer: Self-pay | Admitting: Pharmacist

## 2024-05-18 NOTE — Telephone Encounter (Signed)
 Called Jose Carter to schedule his appointment for the first dose of cabenuva. He will see Pharmacy on 05/31/24. Explained that he will have a $40 copay for each visit, he stated his understanding and said it was doable for him. Counseled again that the first 2 injections will be 1 month apart then every 2 months for maintenance. He's very excited to get started.   Valarie Garner, PharmD PGY1 Pharmacy Resident

## 2024-05-30 NOTE — Progress Notes (Deleted)
 HPI: Jose Carter is a 48 y.o. male who presents to the [facility] pharmacy clinic for [place] administration.  Patient Active Problem List   Diagnosis Date Noted   Hyperlipidemia 05/09/2024   History of colon polyps 02/09/2024   Need for prophylactic vaccination and inoculation against influenza 09/14/2023   Transaminitis 03/16/2023   Sore throat 09/02/2022   Anxiety 07/21/2022   Pain and swelling of right knee 02/25/2022   Seasonal allergic conjunctivitis 06/13/2021   Frequency of urination and polyuria 10/28/2020   Weight gain 12/31/2017   Tobacco abuse 12/31/2017   Fatigue 08/04/2017   Screening examination for venereal disease 11/22/2014   Encounter for long-term (current) use of medications 11/22/2014   Rash and nonspecific skin eruption 11/22/2014   Substance abuse (HCC) 04/11/2013   Epididymitis, left 02/21/2013   Renal insufficiency 08/25/2012   Bell's palsy 08/11/2012   HIV disease (HCC) 01/14/2012   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   OVER THE COUNTER MEDICATION    Takes intek revolution and berbertrim supplements  Modified Medications   No medications on file  Discontinued Medications   No medications on file    Allergies: Allergies  Allergen Reactions   Sustiva [Efavirenz] Swelling    Pt states he was told by prison physician to list Sustiva as an allergy.     Labs: Lab Results  Component Value Date   HIV1RNAQUANT <20 (H) 02/09/2024   HIV1RNAQUANT Not Detected 09/14/2023   HIV1RNAQUANT Not Detected 03/01/2023   CD4TABS 569 09/14/2023   CD4TABS 602 03/01/2023   CD4TABS 649 07/06/2022    RPR and STI Lab Results  Component Value Date   LABRPR NON-REACTIVE 02/09/2024   LABRPR NON-REACTIVE 09/14/2023   LABRPR NON-REACTIVE 03/01/2023   LABRPR NON-REACTIVE  04/29/2021   LABRPR NON-REACTIVE 04/17/2020    STI Results GC CT  09/14/2023  8:48 AM Negative    Negative  Negative    Negative   09/02/2022  4:36 PM Negative    Negative  Negative    Negative   07/21/2022 11:19 AM Negative  Negative   07/06/2022 11:14 AM Negative  Negative   04/29/2021  2:22 PM Negative  Negative   04/17/2020  2:35 PM Negative  Negative   09/22/2018 12:00 AM Negative    Negative    **POSITIVE**  **POSITIVE**    Negative    Negative   06/16/2017 12:00 AM Negative  Negative     Hepatitis B Lab Results  Component Value Date   HEPBSAB INDETER (A) 12/31/2011   HEPBSAG NEGATIVE 12/31/2011   HEPBCAB NEG 12/31/2011   Hepatitis C No results found for: "HEPCAB", "HCVRNAPCRQN" Hepatitis A Lab Results  Component Value Date   HAV NEG 12/31/2011   Lipids: Lab Results  Component Value Date   CHOL 181 02/09/2024   TRIG 61 02/09/2024   HDL 38 (L) 02/09/2024   CHOLHDL 4.8 02/09/2024   VLDL 24 06/16/2017   LDLCALC 128 (H) 02/09/2024    Current HIV Regimen: ***  TARGET DATE: 11th  Assessment: *** presents today for *** first initiation injection for Cabenuva. Counseled that Guinea is two separate intramuscular injections in the gluteal muscle on each side for each visit. Explained that the second injection is 30 days after the initial injection then every 2 months thereafter. Discussed the rare but  significant chance of developing resistance despite compliance. Explained that showing up to injection appointments is very important and warned that if 2 appointments are missed, it will be reassessed by their provider whether they are a good candidate for injection therapy. Counseled on possible side effects associated with the injections such as injection site pain, which is usually mild to moderate in nature, injection site nodules, and injection site reactions. Asked to call the clinic or send me a mychart message if they experience any issues, such as fatigue,  nausea, headache, rash, or dizziness. Advised that they can take ibuprofen  or tylenol  for injection site pain if needed.   Administered cabotegravir 600mg /41mL in left upper outer quadrant of the gluteal muscle. Administered rilpivirine 900 mg/3mL in the right upper outer quadrant of the gluteal muscle. Monitored patient for 10 minutes after injection. Injections were tolerated well without issue. Counseled to stop taking *** after today's dose and to call with any issues that may arise. Will make follow up appointments for second initiation injection in 30 days and then maintenance injections every 2 months thereafter.   Plan: - Stop *** after today's dose - First Cabenuva injections administered - Second initiation injection scheduled for *** - Maintenance injections scheduled for *** - Call with any issues or questions  Tolu Danelia Snodgrass, PharmD [employer] PGY-1

## 2024-05-31 ENCOUNTER — Ambulatory Visit: Admitting: Pharmacist

## 2024-06-03 NOTE — Progress Notes (Signed)
 HPI: Jose Carter is a 48 y.o. male who presents to the [facility] pharmacy clinic for [place] administration.  Patient Active Problem List   Diagnosis Date Noted   Hyperlipidemia 05/09/2024   History of colon polyps 02/09/2024   Need for prophylactic vaccination and inoculation against influenza 09/14/2023   Transaminitis 03/16/2023   Sore throat 09/02/2022   Anxiety 07/21/2022   Pain and swelling of right knee 02/25/2022   Seasonal allergic conjunctivitis 06/13/2021   Frequency of urination and polyuria 10/28/2020   Weight gain 12/31/2017   Tobacco abuse 12/31/2017   Fatigue 08/04/2017   Screening examination for venereal disease 11/22/2014   Encounter for long-term (current) use of medications 11/22/2014   Rash and nonspecific skin eruption 11/22/2014   Substance abuse (HCC) 04/11/2013   Epididymitis, left 02/21/2013   Renal insufficiency 08/25/2012   Bell's palsy 08/11/2012   HIV disease (HCC) 01/14/2012   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   OVER THE COUNTER MEDICATION    Takes intek revolution and berbertrim supplements  Modified Medications   No medications on file  Discontinued Medications   No medications on file    Allergies: Allergies  Allergen Reactions   Sustiva [Efavirenz] Swelling    Pt states he was told by prison physician to list Sustiva as an allergy.     Labs: Lab Results  Component Value Date   HIV1RNAQUANT <20 (H) 02/09/2024   HIV1RNAQUANT Not Detected 09/14/2023   HIV1RNAQUANT Not Detected 03/01/2023   CD4TABS 569 09/14/2023   CD4TABS 602 03/01/2023   CD4TABS 649 07/06/2022    RPR and STI Lab Results  Component Value Date   LABRPR NON-REACTIVE 02/09/2024   LABRPR NON-REACTIVE 09/14/2023   LABRPR NON-REACTIVE 03/01/2023   LABRPR NON-REACTIVE  04/29/2021   LABRPR NON-REACTIVE 04/17/2020    STI Results GC CT  09/14/2023  8:48 AM Negative    Negative  Negative    Negative   09/02/2022  4:36 PM Negative    Negative  Negative    Negative   07/21/2022 11:19 AM Negative  Negative   07/06/2022 11:14 AM Negative  Negative   04/29/2021  2:22 PM Negative  Negative   04/17/2020  2:35 PM Negative  Negative   09/22/2018 12:00 AM Negative    Negative    **POSITIVE**  **POSITIVE**    Negative    Negative   06/16/2017 12:00 AM Negative  Negative     Hepatitis B Lab Results  Component Value Date   HEPBSAB INDETER (A) 12/31/2011   HEPBSAG NEGATIVE 12/31/2011   HEPBCAB NEG 12/31/2011   Hepatitis C No results found for: HEPCAB, HCVRNAPCRQN Hepatitis A Lab Results  Component Value Date   HAV NEG 12/31/2011   Lipids: Lab Results  Component Value Date   CHOL 181 02/09/2024   TRIG 61 02/09/2024   HDL 38 (L) 02/09/2024   CHOLHDL 4.8 02/09/2024   VLDL 24 06/16/2017   LDLCALC 128 (H) 02/09/2024    Current HIV Regimen: Biktarvy  TARGET DATE: 17th  Assessment: Jaishon presents today for his first initiation injection for Cabenuva. Counseled that Guinea is two separate intramuscular injections in the gluteal muscle on each side for each visit. Explained that the second injection is 30 days after the initial injection then every 2 months thereafter. Discussed the rare but  significant chance of developing resistance despite compliance. Explained that showing up to injection appointments is very important and warned that if 2 appointments are missed, it will be reassessed by their provider whether they are a good candidate for injection therapy. Counseled on possible side effects associated with the injections such as injection site pain, which is usually mild to moderate in nature, injection site nodules, and injection site reactions. Asked to call the clinic or send me a mychart message if they experience any issues, such as  fatigue, nausea, headache, rash, or dizziness. Advised that they can take ibuprofen  or tylenol  for injection site pain if needed.   Administered cabotegravir 600mg /50mL in left upper outer quadrant of the gluteal muscle. Administered rilpivirine 900 mg/3mL in the right upper outer quadrant of the gluteal muscle. Monitored patient for 10 minutes after injection. Injections were tolerated well without issue. Counseled to stop taking Biktarvy after today's dose and to call with any issues that may arise. Will make follow up appointments for second initiation injection in 30 days and then maintenance injections every 2 months thereafter.   Majd is eligible for Shingrix and Menveo injections today. He elected to receive Menveo. Will defer Shingrix today.   Plan: - Stop Biktarvy after today's dose - Administered Menveo injection - First Cabenuva injections administered - Second set of initiation injections scheduled for 07/16 with Rph Cassie - Will collect HIV RNA and check HAV antibodies at next visit - Maintenance injections scheduled for 09/17 with Dr. Ernie Heal - Call with any issues or questions  Tolu Aynslee Mulhall, PharmD [facility] PGY-1

## 2024-06-06 ENCOUNTER — Other Ambulatory Visit: Payer: Self-pay

## 2024-06-06 ENCOUNTER — Ambulatory Visit (INDEPENDENT_AMBULATORY_CARE_PROVIDER_SITE_OTHER): Payer: Self-pay | Admitting: Pharmacist

## 2024-06-06 DIAGNOSIS — B20 Human immunodeficiency virus [HIV] disease: Secondary | ICD-10-CM | POA: Diagnosis not present

## 2024-06-06 DIAGNOSIS — Z23 Encounter for immunization: Secondary | ICD-10-CM | POA: Diagnosis not present

## 2024-06-06 MED ORDER — CABOTEGRAVIR & RILPIVIRINE ER 600 & 900 MG/3ML IM SUER: 1.0000 | INTRAMUSCULAR | Status: AC

## 2024-06-06 MED ORDER — CABOTEGRAVIR & RILPIVIRINE ER 600 & 900 MG/3ML IM SUER
1.0000 | Freq: Once | INTRAMUSCULAR | Status: AC
  Administered 2024-06-06: 1 via INTRAMUSCULAR

## 2024-06-30 ENCOUNTER — Other Ambulatory Visit (HOSPITAL_COMMUNITY): Payer: Self-pay

## 2024-07-04 NOTE — Progress Notes (Unsigned)
 HPI: Jose Carter is a 48 y.o. male who presents to the [facility] pharmacy clinic for Cabenuva  administration.  Patient Active Problem List   Diagnosis Date Noted   Hyperlipidemia 05/09/2024   History of colon polyps 02/09/2024   Need for prophylactic vaccination and inoculation against influenza 09/14/2023   Transaminitis 03/16/2023   Sore throat 09/02/2022   Anxiety 07/21/2022   Pain and swelling of right knee 02/25/2022   Seasonal allergic conjunctivitis 06/13/2021   Frequency of urination and polyuria 10/28/2020   Weight gain 12/31/2017   Tobacco abuse 12/31/2017   Fatigue 08/04/2017   Screening examination for venereal disease 11/22/2014   Encounter for long-term (current) use of medications 11/22/2014   Rash and nonspecific skin eruption 11/22/2014   Substance abuse (HCC) 04/11/2013   Epididymitis, left 02/21/2013   Renal insufficiency 08/25/2012   Bell's palsy 08/11/2012   HIV disease (HCC) 01/14/2012   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   OVER THE COUNTER MEDICATION    Takes intek revolution and berbertrim supplements  Modified Medications   No medications on file  Discontinued Medications   No medications on file    Allergies: Allergies  Allergen Reactions   Sustiva [Efavirenz] Swelling    Pt states he was told by prison physician to list Sustiva as an allergy.     Labs: Lab Results  Component Value Date   HIV1RNAQUANT <20 (H) 02/09/2024   HIV1RNAQUANT Not Detected 09/14/2023   HIV1RNAQUANT Not Detected 03/01/2023   CD4TABS 569 09/14/2023   CD4TABS 602 03/01/2023   CD4TABS 649 07/06/2022    RPR and STI Lab Results  Component Value Date   LABRPR NON-REACTIVE 02/09/2024   LABRPR NON-REACTIVE 09/14/2023   LABRPR NON-REACTIVE 03/01/2023   LABRPR  NON-REACTIVE 04/29/2021   LABRPR NON-REACTIVE 04/17/2020    STI Results GC CT  09/14/2023  8:48 AM Negative    Negative  Negative    Negative   09/02/2022  4:36 PM Negative    Negative  Negative    Negative   07/21/2022 11:19 AM Negative  Negative   07/06/2022 11:14 AM Negative  Negative   04/29/2021  2:22 PM Negative  Negative   04/17/2020  2:35 PM Negative  Negative   09/22/2018 12:00 AM Negative    Negative    **POSITIVE**  **POSITIVE**    Negative    Negative   06/16/2017 12:00 AM Negative  Negative     Hepatitis B Lab Results  Component Value Date   HEPBSAB INDETER (A) 12/31/2011   HEPBSAG NEGATIVE 12/31/2011   HEPBCAB NEG 12/31/2011   Hepatitis C No results found for: HEPCAB, HCVRNAPCRQN Hepatitis A Lab Results  Component Value Date   HAV NEG 12/31/2011   Lipids: Lab Results  Component Value Date   CHOL 181 02/09/2024   TRIG 61 02/09/2024   HDL 38 (L) 02/09/2024   CHOLHDL 4.8 02/09/2024   VLDL 24 06/16/2017   LDLCALC 128 (H) 02/09/2024    TARGET DATE: The 17th  Assessment: Jose Carter presents today for his second set of Cabenuva  initiation injections. First set of injections were tolerated well. He did have a decently big nodule on his right side. Made sure to avoid the area today. He states that it does not hurt. Last HIV RNA  was undetectable in February; will check again today. Will also add Hepatitis A antibody to assess for immunity/need for vaccination. Requesting STI testing today and inquired about doxyPEP.  Discussed administration, timing, and side effects. Will send Rx to [facility]. He loves the shot and states it is life changing not to have to take a pill every day.   Administered cabotegravir  600mg /39mL in left upper outer quadrant of the gluteal muscle. Administered rilpivirine  900 mg/3mL in the right upper outer quadrant ofthe gluteal muscle. No issues with injections. he will follow up in 2 months for next set of injections.   Plan: -  Cabenuva  injections administered - HIV RNA, Hepatitis A, RPR, and urine/rectal/pharyngeal cytologies for GC/chlamydia antibody - DoxyPEP Rx sent to [facility] - Next injections scheduled for 09/06/24 with Dr. Fleeta Rothman and 11/07/24 with Alan - Call with any issues or questions  Markisha Meding L. Daimon Kean, PharmD, BCIDP, AAHIVP, CPP Clinical Pharmacist Practitioner - Infectious Diseases Clinical Pharmacist Lead - Specialty Pharmacy [facility] 07/04/2024, 4:15 PM

## 2024-07-05 ENCOUNTER — Other Ambulatory Visit: Payer: Self-pay

## 2024-07-05 ENCOUNTER — Ambulatory Visit (INDEPENDENT_AMBULATORY_CARE_PROVIDER_SITE_OTHER): Payer: Self-pay | Admitting: Pharmacist

## 2024-07-05 DIAGNOSIS — Z113 Encounter for screening for infections with a predominantly sexual mode of transmission: Secondary | ICD-10-CM

## 2024-07-05 DIAGNOSIS — B20 Human immunodeficiency virus [HIV] disease: Secondary | ICD-10-CM | POA: Diagnosis not present

## 2024-07-05 LAB — CYTOLOGY, (ORAL, ANAL, URETHRAL) ANCILLARY ONLY
Chlamydia: NEGATIVE
Chlamydia: NEGATIVE
Comment: NEGATIVE
Comment: NEGATIVE
Comment: NORMAL
Comment: NORMAL
Neisseria Gonorrhea: NEGATIVE
Neisseria Gonorrhea: NEGATIVE

## 2024-07-05 LAB — URINE CYTOLOGY ANCILLARY ONLY
Chlamydia: NEGATIVE
Comment: NEGATIVE
Comment: NORMAL
Neisseria Gonorrhea: NEGATIVE

## 2024-07-05 MED ORDER — DOXYCYCLINE HYCLATE 100 MG PO TABS
ORAL_TABLET | ORAL | 2 refills | Status: AC
Start: 2024-07-05 — End: ?

## 2024-07-05 MED ORDER — CABOTEGRAVIR & RILPIVIRINE ER 600 & 900 MG/3ML IM SUER
1.0000 | Freq: Once | INTRAMUSCULAR | Status: AC
  Administered 2024-07-05: 1 via INTRAMUSCULAR

## 2024-07-07 ENCOUNTER — Encounter: Payer: Self-pay | Admitting: Internal Medicine

## 2024-07-07 LAB — HIV-1 RNA QUANT-NO REFLEX-BLD
HIV 1 RNA Quant: <20 {copies}/mL — AB
HIV-1 RNA Quant, Log: <1.3 {Log_copies}/mL — AB

## 2024-07-07 LAB — RPR: RPR Ser Ql: NONREACTIVE

## 2024-07-07 LAB — HEPATITIS A ANTIBODY, TOTAL: Hepatitis A AB,Total: REACTIVE — AB

## 2024-07-10 ENCOUNTER — Ambulatory Visit: Payer: Self-pay

## 2024-08-27 ENCOUNTER — Encounter (HOSPITAL_BASED_OUTPATIENT_CLINIC_OR_DEPARTMENT_OTHER): Payer: Self-pay | Admitting: Emergency Medicine

## 2024-08-27 ENCOUNTER — Emergency Department (HOSPITAL_BASED_OUTPATIENT_CLINIC_OR_DEPARTMENT_OTHER): Payer: Self-pay

## 2024-08-27 ENCOUNTER — Observation Stay (HOSPITAL_BASED_OUTPATIENT_CLINIC_OR_DEPARTMENT_OTHER)
Admission: EM | Admit: 2024-08-27 | Discharge: 2024-08-28 | Disposition: A | Discharge status: Home / Self Care | Payer: Self-pay | Attending: Internal Medicine | Admitting: Internal Medicine

## 2024-08-27 ENCOUNTER — Other Ambulatory Visit: Payer: Self-pay

## 2024-08-27 DIAGNOSIS — B20 Human immunodeficiency virus [HIV] disease: Secondary | ICD-10-CM | POA: Diagnosis present

## 2024-08-27 DIAGNOSIS — R1084 Generalized abdominal pain: Secondary | ICD-10-CM | POA: Diagnosis not present

## 2024-08-27 DIAGNOSIS — N182 Chronic kidney disease, stage 2 (mild): Secondary | ICD-10-CM | POA: Diagnosis not present

## 2024-08-27 DIAGNOSIS — K56600 Partial intestinal obstruction, unspecified as to cause: Secondary | ICD-10-CM | POA: Diagnosis not present

## 2024-08-27 DIAGNOSIS — K529 Noninfective gastroenteritis and colitis, unspecified: Principal | ICD-10-CM | POA: Diagnosis present

## 2024-08-27 DIAGNOSIS — R112 Nausea with vomiting, unspecified: Secondary | ICD-10-CM | POA: Diagnosis not present

## 2024-08-27 DIAGNOSIS — Z7401 Bed confinement status: Secondary | ICD-10-CM | POA: Diagnosis not present

## 2024-08-27 DIAGNOSIS — Z21 Asymptomatic human immunodeficiency virus [HIV] infection status: Secondary | ICD-10-CM | POA: Diagnosis not present

## 2024-08-27 DIAGNOSIS — I129 Hypertensive chronic kidney disease with stage 1 through stage 4 chronic kidney disease, or unspecified chronic kidney disease: Secondary | ICD-10-CM | POA: Insufficient documentation

## 2024-08-27 DIAGNOSIS — R109 Unspecified abdominal pain: Secondary | ICD-10-CM | POA: Diagnosis not present

## 2024-08-27 DIAGNOSIS — R933 Abnormal findings on diagnostic imaging of other parts of digestive tract: Secondary | ICD-10-CM | POA: Diagnosis not present

## 2024-08-27 DIAGNOSIS — R1013 Epigastric pain: Secondary | ICD-10-CM | POA: Diagnosis not present

## 2024-08-27 DIAGNOSIS — K567 Ileus, unspecified: Secondary | ICD-10-CM | POA: Insufficient documentation

## 2024-08-27 LAB — COMPREHENSIVE METABOLIC PANEL WITH GFR
ALT: 26 U/L (ref 0–44)
AST: 36 U/L (ref 15–41)
Albumin: 4.5 g/dL (ref 3.5–5.0)
Alkaline Phosphatase: 66 U/L (ref 38–126)
Anion gap: 13 (ref 5–15)
BUN: 24 mg/dL — ABNORMAL HIGH (ref 6–20)
CO2: 22 mmol/L (ref 22–32)
Calcium: 9.3 mg/dL (ref 8.9–10.3)
Chloride: 103 mmol/L (ref 98–111)
Creatinine, Ser: 1.42 mg/dL — ABNORMAL HIGH (ref 0.61–1.24)
GFR, Estimated: >60 mL/min (ref >60)
Glucose, Bld: 113 mg/dL — ABNORMAL HIGH (ref 70–99)
Potassium: 4.3 mmol/L (ref 3.5–5.1)
Sodium: 138 mmol/L (ref 135–145)
Total Bilirubin: 0.2 mg/dL (ref 0.0–1.2)
Total Protein: 7.6 g/dL (ref 6.5–8.1)

## 2024-08-27 LAB — CBC WITH DIFFERENTIAL/PLATELET
Abs Immature Granulocytes: 0.03 K/uL (ref 0.00–0.07)
Basophils Absolute: 0 K/uL (ref 0.0–0.1)
Basophils Relative: 0 %
Eosinophils Absolute: 0.3 K/uL (ref 0.0–0.5)
Eosinophils Relative: 3 %
HCT: 39.4 % (ref 39.0–52.0)
Hemoglobin: 13.5 g/dL (ref 13.0–17.0)
Immature Granulocytes: 0 %
Lymphocytes Relative: 33 %
Lymphs Abs: 3.3 K/uL (ref 0.7–4.0)
MCH: 27.2 pg (ref 26.0–34.0)
MCHC: 34.3 g/dL (ref 30.0–36.0)
MCV: 79.4 fL — ABNORMAL LOW (ref 80.0–100.0)
Monocytes Absolute: 0.7 K/uL (ref 0.1–1.0)
Monocytes Relative: 7 %
Neutro Abs: 5.7 K/uL (ref 1.7–7.7)
Neutrophils Relative %: 57 %
Platelets: 280 K/uL (ref 150–400)
RBC: 4.96 MIL/uL (ref 4.22–5.81)
RDW: 13.7 % (ref 11.5–15.5)
WBC: 10.1 K/uL (ref 4.0–10.5)
nRBC: 0 % (ref 0.0–0.2)

## 2024-08-27 LAB — URINALYSIS, ROUTINE W REFLEX MICROSCOPIC
Bilirubin Urine: NEGATIVE
Glucose, UA: NEGATIVE mg/dL
Hgb urine dipstick: NEGATIVE
Ketones, ur: NEGATIVE mg/dL
Leukocytes,Ua: NEGATIVE
Nitrite: NEGATIVE
Protein, ur: NEGATIVE mg/dL
Specific Gravity, Urine: 1.01 (ref 1.005–1.030)
pH: 6 (ref 5.0–8.0)

## 2024-08-27 LAB — LIPASE, BLOOD: Lipase: 43 U/L (ref 11–51)

## 2024-08-27 MED ORDER — PROCHLORPERAZINE EDISYLATE 10 MG/2ML IJ SOLN: 10.0000 mg | Freq: Four times a day (QID) | INTRAMUSCULAR | Status: DC | PRN

## 2024-08-27 MED ORDER — LACTATED RINGERS IV SOLN: INTRAVENOUS | Status: AC

## 2024-08-27 MED ORDER — SODIUM CHLORIDE 0.9 % IV BOLUS
1000.0000 mL | Freq: Once | INTRAVENOUS | Status: AC
  Administered 2024-08-27: 1000 mL via INTRAVENOUS

## 2024-08-27 MED ORDER — SODIUM CHLORIDE 0.9 % IV SOLN
2.0000 g | INTRAVENOUS | Status: DC
  Administered 2024-08-27 – 2024-08-28 (×2): 2 g via INTRAVENOUS
  Filled 2024-08-27 (×2): qty 20

## 2024-08-27 MED ORDER — SODIUM CHLORIDE 0.9% FLUSH
3.0000 mL | Freq: Two times a day (BID) | INTRAVENOUS | Status: DC
  Administered 2024-08-28: 3 mL via INTRAVENOUS

## 2024-08-27 MED ORDER — ACETAMINOPHEN 10 MG/ML IV SOLN
1000.0000 mg | Freq: Four times a day (QID) | INTRAVENOUS | Status: AC | PRN
  Administered 2024-08-27: 1000 mg via INTRAVENOUS
  Filled 2024-08-27: qty 100

## 2024-08-27 MED ORDER — ONDANSETRON HCL 4 MG/2ML IJ SOLN: 4.0000 mg | Freq: Four times a day (QID) | INTRAMUSCULAR | Status: DC | PRN

## 2024-08-27 MED ORDER — IOHEXOL 300 MG/ML  SOLN
100.0000 mL | Freq: Once | INTRAMUSCULAR | Status: AC | PRN
  Administered 2024-08-27: 100 mL via INTRAVENOUS

## 2024-08-27 MED ORDER — ONDANSETRON HCL 4 MG/2ML IJ SOLN
4.0000 mg | Freq: Once | INTRAMUSCULAR | Status: AC
Start: 2024-08-27 — End: 2024-08-27
  Administered 2024-08-27: 4 mg via INTRAVENOUS
  Filled 2024-08-27: qty 2

## 2024-08-27 MED ORDER — METRONIDAZOLE 500 MG/100ML IV SOLN
500.0000 mg | Freq: Two times a day (BID) | INTRAVENOUS | Status: DC
  Administered 2024-08-27 – 2024-08-28 (×3): 500 mg via INTRAVENOUS
  Filled 2024-08-27 (×3): qty 100

## 2024-08-27 MED ORDER — HYDROMORPHONE HCL 1 MG/ML IJ SOLN
1.0000 mg | Freq: Once | INTRAMUSCULAR | Status: AC
  Administered 2024-08-27: 1 mg via INTRAVENOUS
  Filled 2024-08-27: qty 1

## 2024-08-27 NOTE — Progress Notes (Signed)
 Hospitalist Transfer Note:    Nursing staff, Please call [facility] Admits & Consults System-Wide number on Amion [phone]) as soon as patient's arrival, so appropriate admitting provider can evaluate the pt.   Transferring facility: [facility] Requesting provider: Dr. Geroldine (EDP at [facility]) Reason for transfer: admission for further evaluation and management of enterocolitis.    48 y.o. male with h/o HIV, who presented to [facility] ED complaining of 1 day of epigastric discomfort associated with nausea resulting in multiple episodes of nonbloody, nonbilious emesis.  He denies any recent diarrhea, and conveys that most recent bowel movement occurred earlier this evening.  Vital signs in the ED were notable for the following: Afebrile; heart rates in the 60s to 80s; systolic blood pressures in the 120s mmHg.   Labs were notable for wbc of 10,100 on presenting cbc. Lipase 43.  Per my discussions with EDP, no evidence of significant transaminitis on presenting cmp.  Imaging notable for CT abdomen/pelvis with contrast, which was read as demonstrating findings consistent with enterocolitis, but with radiology read also noting that small bowel obstruction cannot be excluded.  No evidence of associated bowel abscess or perforation.  Medications administered prior to transfer included the following: Dilaudid  1 mg IV x 1 dose, Zofran  4 mg IV x 1 dose, normal saline x 1 L bolus.   Subsequently, I accepted this patient for transfer for observation to a med-tele bed at [facility] or [facility] (first available) for further work-up and management of the above.      Eva Pore, DO Hospitalist

## 2024-08-27 NOTE — Plan of Care (Signed)
  Problem: Education: Goal: Knowledge of General Education information will improve Description: Including pain rating scale, medication(s)/side effects and non-pharmacologic comfort measures Outcome: Progressing   Problem: Clinical Measurements: Goal: Ability to maintain clinical measurements within normal limits will improve Outcome: Progressing Goal: Respiratory complications will improve Outcome: Progressing Goal: Cardiovascular complication will be avoided Outcome: Progressing   Problem: Activity: Goal: Risk for activity intolerance will decrease Outcome: Progressing   Problem: Coping: Goal: Level of anxiety will decrease Outcome: Progressing   Problem: Elimination: Goal: Will not experience complications related to bowel motility Outcome: Progressing Goal: Will not experience complications related to urinary retention Outcome: Progressing   Problem: Pain Managment: Goal: General experience of comfort will improve and/or be controlled Outcome: Progressing   Problem: Safety: Goal: Ability to remain free from injury will improve Outcome: Progressing   Problem: Skin Integrity: Goal: Risk for impaired skin integrity will decrease Outcome: Progressing

## 2024-08-27 NOTE — Plan of Care (Signed)

## 2024-08-27 NOTE — Consult Note (Signed)
 Reason for Consult:PSBO vs. Ileus  Referring Physician: Dr. Segars, [facility]  Jose Carter is an 48 y.o. male.  HPI: This is a 48 year old male with HIV and hyperlipidemia who has had no previous abdominal surgery who presents with less than one day of abdominal pain.  He had a meal of truffle fries and chicken wings, then experienced acute onset of upper abdominal pain and distention.  He described crampy abdominal pain.  He developed nausea and vomiting.  He had a small bowel movement prior to going to the ED.    He was evaluated in the ED and CT showed some scattered segments of small bowel thickening with mucosal enhancement.  There is an area of transition in the right abdomen proximal to an area of inflammation.  No free air.  The patient was transferred to [facility] for further management.  He has had no further nausea or vomiting since admission.  He feels less distended and the pain is more in the RLQ.    Past Medical History:  Diagnosis Date   HIV infection (HCC)    Hyperlipidemia 05/09/2024    Past Surgical History:  Procedure Laterality Date   COLONOSCOPY     2008 or 2009 per pt    Family History  Problem Relation Age of Onset   Colon cancer Neg Hx    Rectal cancer Neg Hx    Stomach cancer Neg Hx    Esophageal cancer Neg Hx     Social History:  reports that he quit smoking about 4 years ago. His smoking use included cigarettes. He started smoking about 11 years ago. He has a 3.5 pack-year smoking history. He has never used smokeless tobacco. He reports current alcohol use. He reports that he does not currently use drugs after having used the following drugs: Cocaine and Marijuana.  Allergies:  Allergies  Allergen Reactions   Sustiva [Efavirenz] Swelling    Pt states he was told by prison physician to list Sustiva as an allergy.     Medications:  Prior to Admission medications   Medication Sig Start Date End Date Taking? Authorizing Provider  cabotegravir  & rilpivirine  ER  (CABENUVA ) 600 & 900 MG/3ML injection Inject 1 kit into the muscle every 2 (two) months. 06/06/24  Yes Kuppelweiser, Cassie L, RPH-CPP  CREATINE PO Take 1 Dose by mouth daily.   Yes [provider]  doxycycline  (VIBRA -TABS) 100 MG tablet Take 2 tablets (200 mg) by mouth 24-72 hours after sex 07/05/24  Yes Kuppelweiser, Cassie L, RPH-CPP  OVER THE COUNTER MEDICATION Take 1 tablet by mouth daily. Cardarine   Yes [provider]  OVER THE COUNTER MEDICATION Take 1 tablet by mouth daily. Thermogenic Weight Loss Supplement. Clean Plus   Yes [provider]  Probiotic Product (PROBIOTIC PO) Take 1 Dose by mouth daily as needed.   Yes [provider]     Results for orders placed or performed during the hospital encounter of 08/27/24 (from the past 48 hours)  CBC with Differential     Status: Abnormal   Collection Time: 08/27/24  2:03 AM  Result Value Ref Range   WBC 10.1 4.0 - 10.5 K/uL   RBC 4.96 4.22 - 5.81 MIL/uL   Hemoglobin 13.5 13.0 - 17.0 g/dL   HCT 60.5 60.9 - 47.9 %   MCV 79.4 (L) 80.0 - 100.0 fL   MCH 27.2 26.0 - 34.0 pg   MCHC 34.3 30.0 - 36.0 g/dL   RDW 86.2 88.4 -  15.5 %   Platelets 280 150 - 400 K/uL   nRBC 0.0 0.0 - 0.2 %   Neutrophils Relative % 57 %   Neutro Abs 5.7 1.7 - 7.7 K/uL   Lymphocytes Relative 33 %   Lymphs Abs 3.3 0.7 - 4.0 K/uL   Monocytes Relative 7 %   Monocytes Absolute 0.7 0.1 - 1.0 K/uL   Eosinophils Relative 3 %   Eosinophils Absolute 0.3 0.0 - 0.5 K/uL   Basophils Relative 0 %   Basophils Absolute 0.0 0.0 - 0.1 K/uL   Immature Granulocytes 0 %   Abs Immature Granulocytes 0.03 0.00 - 0.07 K/uL    Comment: Performed at [facility], [address] [place], [place] [address]  Comprehensive metabolic panel     Status: Abnormal   Collection Time: 08/27/24  2:03 AM  Result Value Ref Range   Sodium 138 135 - 145 mmol/L   Potassium 4.3 3.5 - 5.1 mmol/L   Chloride 103 98 - 111 mmol/L   CO2 22 22 - 32 mmol/L    Glucose, Bld 113 (H) 70 - 99 mg/dL    Comment: Glucose reference range applies only to samples taken after fasting for at least 8 hours.   BUN 24 (H) 6 - 20 mg/dL   Creatinine, Ser 8.57 (H) 0.61 - 1.24 mg/dL   Calcium 9.3 8.9 - 89.6 mg/dL   Total Protein 7.6 6.5 - 8.1 g/dL   Albumin 4.5 3.5 - 5.0 g/dL   AST 36 15 - 41 U/L   ALT 26 0 - 44 U/L   Alkaline Phosphatase 66 38 - 126 U/L   Total Bilirubin 0.2 0.0 - 1.2 mg/dL   GFR, Estimated >39 >39 mL/min    Comment: (NOTE) Calculated using the CKD-EPI Creatinine Equation (2021)    Anion gap 13 5 - 15    Comment: Performed at [facility], [address] [place], [place] [address]  Urinalysis, Routine w reflex microscopic -Urine, Clean Catch     Status: None   Collection Time: 08/27/24  2:03 AM  Result Value Ref Range   Color, Urine YELLOW YELLOW   APPearance CLEAR CLEAR   Specific Gravity, Urine 1.010 1.005 - 1.030   pH 6.0 5.0 - 8.0   Glucose, UA NEGATIVE NEGATIVE mg/dL   Hgb urine dipstick NEGATIVE NEGATIVE   Bilirubin Urine NEGATIVE NEGATIVE   Ketones, ur NEGATIVE NEGATIVE mg/dL   Protein, ur NEGATIVE NEGATIVE mg/dL   Nitrite NEGATIVE NEGATIVE   Leukocytes,Ua NEGATIVE NEGATIVE    Comment: Microscopic not done on urines with negative protein, blood, leukocytes, nitrite, or glucose < 500 mg/dL. Performed at [facility], [address] [place], [place] [address]   Lipase, blood     Status: None   Collection Time: 08/27/24  2:03 AM  Result Value Ref Range   Lipase 43 11 - 51 U/L    Comment: Performed at [facility], [address] [place], [place] [address]    CT ABDOMEN PELVIS W CONTRAST Result Date: 08/27/2024 EXAM: CT ABDOMEN AND PELVIS WITH CONTRAST 08/27/2024 03:00:00 AM TECHNIQUE: CT of the abdomen and pelvis was performed with the administration of intravenous contrast. Multiplanar reformatted images are provided for review. Automated exposure control, iterative reconstruction, and/or  weight-based adjustment of the mA/kV was utilized to reduce the radiation dose to as low as reasonably achievable. COMPARISON: None available. CLINICAL HISTORY: Abdominal pain, acute, nonlocalized. Pt coming from home  with abdominal pain with nausea and vomiting that started at 2200 last night. FINDINGS: LOWER CHEST: No acute abnormality. LIVER: The liver is unremarkable. GALLBLADDER AND BILE DUCTS: Gallbladder is unremarkable. No biliary ductal dilatation. SPLEEN: No acute abnormality. PANCREAS: No acute abnormality. ADRENAL GLANDS: No acute abnormality. KIDNEYS, URETERS AND BLADDER: No stones in the kidneys or ureters. No hydronephrosis. No perinephric or periureteral stranding. Urinary bladder is unremarkable. GI AND BOWEL: Scattered segments of small bowel wall thickening and mucosal hyperenhancement, for example, on series 6 image 43 in the right hemiabdomen and in the low central abdomen on series 6 image 54. There is mild upstream fluid-filled distention of the small bowel with an abrupt transition point in the area of inflammation in the right abdomen. Question mucosal hyperenhancement diffusely throughout the colon versus under distention. PERITONEUM AND RETROPERITONEUM: No ascites. No free air. VASCULATURE: Aorta is normal in caliber. LYMPH NODES: No lymphadenopathy. REPRODUCTIVE ORGANS: No acute abnormality. BONES AND SOFT TISSUES: No acute osseous abnormality. Injection granulomas in the gluteal soft tissues. IMPRESSION: 1. Findings favor enterocolitis with associated ileus. Partial or early obstruction at an area of inflammation in the small bowel in the right abdomen is not excluded. Electronically signed by: Norman Gatlin MD 08/27/2024 03:15 AM EDT RP Workstation: [id number]    Review of Systems  HENT:  Negative for ear discharge, ear pain, hearing loss and tinnitus.   Eyes:  Negative for photophobia and pain.  Respiratory:  Negative for cough and shortness of breath.   Cardiovascular:   Negative for chest pain.  Gastrointestinal:  Positive for abdominal distention, abdominal pain, nausea and vomiting.  Genitourinary:  Negative for dysuria, flank pain, frequency and urgency.  Musculoskeletal:  Negative for back pain, myalgias and neck pain.  Neurological:  Negative for dizziness and headaches.  Hematological:  Does not bruise/bleed easily.  Psychiatric/Behavioral:  The patient is not nervous/anxious.    Blood pressure 120/72, pulse 67, temperature 98 F (36.7 C), temperature source Oral, resp. rate 19, height 6' 1 (1.854 m), weight 118.4 kg, SpO2 99%. Physical Exam Constitutional:  WDWN in NAD, conversant, no obvious deformities; lying in bed comfortably Eyes:  Pupils equal, round; sclera anicteric; moist conjunctiva; no lid lag HENT:  Oral mucosa moist; good dentition  Neck:  No masses palpated, trachea midline; no thyromegaly Lungs:  CTA bilaterally; normal respiratory effort CV:  Regular rate and rhythm; no murmurs; extremities well-perfused with no edema Abd:  +bowel sounds, moderately distended, tender in RLQ, no palpable organomegaly; no palpable hernias Musc:  Unable to assess gait; no apparent clubbing or cyanosis in extremities Lymphatic:  No palpable cervical or axillary lymphadenopathy Skin:  Warm, dry; no sign of jaundice Psychiatric - alert and oriented x 4; calm mood and affect  Assessment/Plan: Abdominal pain, nausea, vomiting with dilated segments of small bowel with mucosal enhancement  This likely represents enterocolitis with secondary ileus, which can be managed with bowel rest and IV hydration/ abx.  Small bowel obstruction is less likely.  No further nausea or vomiting, so would not place NG tube unless N/V recur.  Follow-up films and labs tomorrow.  I cannot visualize the appendix on the CT scan and radiology did not comment on the appearance of the appendix.  Would follow clinical examination and WBC.    Donnice POUR Kwadwo Taras 08/27/2024, 10:12 AM

## 2024-08-27 NOTE — ED Triage Notes (Signed)
 Pt coming from home with abdominal pain with nausea and vomiting that started at 2200 last night.

## 2024-08-27 NOTE — Progress Notes (Signed)
 Same day note  Jose Carter is a 48 y.o. male with past medical history of CKD stage 2-3 who presented to the hospital with acute onset of abd pain, and nausea vomiting for 1 day. Reported that earlier in the day he ate some [employer] that did not taste right. Then later in the evening he was going to a party and on the way there had worsening abdominal pain and spasms and subsequent had nausea and vomiting.  Patient took took 2 dulcolax to try to have a BM, reports having very small hard BM last night. Has not passed any gas since last night.   Patient seen and examined at bedside.  Patient was admitted to the hospital for abdominal pain  At the time of my evaluation, patient complains of intermittent spasms of abdominal pain, vomited yesterday and last BM yesterday.  Physical examination reveals muscular appearing male, tenderness on palpation of the abdomen especially on the right side.  Laboratory data and imaging was reviewed  Assessment and Plan.  Acute enterocolitis Joan associated ileus  v. early/partial obstruction  Patient presented with nausea vomiting abdominal distention.  Imaging showed some enterocolitis with possible likely ileus or early obstruction.  Continue n.p.o. IV fluids Rocephin  and Flagyl  and Zofran ..  No leukocytosis.  Has been having intermittent spasmodic pain.  Tenderness over the right lower quadrant.   CKD stage II-III: Creatinine on presentation at 1.4.  Has been stable.  Will continue to monitor closely.  History of HIV.  Is on Cabenuva  injectable outpatient.  Last HIV RNA was less than 20 on 07/05/2024.  No Charge  Signed,  Vernal Anselm Alstrom, MD [facility]

## 2024-08-27 NOTE — Plan of Care (Signed)

## 2024-08-27 NOTE — ED Provider Notes (Signed)
 Jose Carter Provider Note   CSN: [id number] Arrival date & time: 08/27/24  9857     Patient presents with: Abdominal Pain   Jose Carter is a 48 y.o. male.   Patient is a 48 year old male with history of HIV disease.  Patient presenting today with complaints of abdominal pain.  This started approximately 3 hours ago.  He describes severe, crampy pain to his upper abdomen along with several episodes of nausea and vomiting.  He does report having a bowel movement prior to coming here.  No fevers or chills.  He denies any history of prior abdominal surgery.       Prior to Admission medications   Medication Sig Start Date End Date Taking? Authorizing Provider  cabotegravir  & rilpivirine  ER (CABENUVA ) 600 & 900 MG/3ML injection Inject 1 kit into the muscle every 2 (two) months. 06/06/24   Kuppelweiser, Cassie L, RPH-CPP  doxycycline  (VIBRA -TABS) 100 MG tablet Take 2 tablets (200 mg) by mouth 24-72 hours after sex 07/05/24   Kuppelweiser, Cassie L, RPH-CPP  OVER THE COUNTER MEDICATION Takes intek revolution and berbertrim supplements    [provider]    Allergies: Sustiva [efavirenz]    Review of Systems  All other systems reviewed and are negative.   Updated Vital Signs BP 129/78   Pulse 80   Resp 20   SpO2 100%   Physical Exam Vitals and nursing note reviewed.  Constitutional:      General: He is not in acute distress.    Appearance: He is well-developed. He is not diaphoretic.  HENT:     Head: Normocephalic and atraumatic.  Cardiovascular:     Rate and Rhythm: Normal rate and regular rhythm.     Heart sounds: No murmur heard.    No friction rub.  Pulmonary:     Effort: Pulmonary effort is normal. No respiratory distress.     Breath sounds: Normal breath sounds. No wheezing or rales.  Abdominal:     General: Bowel sounds are normal. There is no distension.     Palpations: Abdomen is soft.     Tenderness: There  is abdominal tenderness in the right upper quadrant and epigastric area. There is no right CVA tenderness, left CVA tenderness, guarding or rebound.  Musculoskeletal:        General: Normal range of motion.     Cervical back: Normal range of motion and neck supple.  Skin:    General: Skin is warm and dry.  Neurological:     Mental Status: He is alert and oriented to person, place, and time.     Coordination: Coordination normal.     (all labs ordered are listed, but only abnormal results are displayed) Labs Reviewed  CBC WITH DIFFERENTIAL/PLATELET  COMPREHENSIVE METABOLIC PANEL WITH GFR  URINALYSIS, ROUTINE W REFLEX MICROSCOPIC  LIPASE, BLOOD    EKG: None  Radiology: No results found.   Procedures   Medications Ordered in the ED  HYDROmorphone  (DILAUDID ) injection 1 mg (has no administration in time range)  ondansetron  (ZOFRAN ) injection 4 mg (has no administration in time range)  sodium chloride  0.9 % bolus 1,000 mL (has no administration in time range)                                    Medical Decision Making Amount and/or Complexity of Data Reviewed Labs: ordered. Radiology: ordered.  Risk Prescription drug management. Decision regarding hospitalization.   Patient presenting here with severe epigastric pain starting just 2 hours prior to arrival.  Patient arrives here in significant discomfort and has tenderness to the epigastric region.  Laboratory studies obtained including CBC, CMP, and lipase, all of which are unremarkable.  CT scan obtained showing enterocolitis with ileus versus early small bowel obstruction.  Patient given IV fluids along with Dilaudid  for pain and Zofran  for nausea and seems to be feeling somewhat better, although he continues to complain of some discomfort.  I feel as though admission for observation is appropriate.  I spoken with the hospitalist who agrees to admit.     Final diagnoses:  None    ED Discharge Orders      None          Geroldine Berg, MD 08/27/24 [phone]

## 2024-08-27 NOTE — H&P (Signed)
 History and Physical    MALIKHI OGAN FMW:[id number] DOB: [birth year] DOA: 08/27/2024  PCP: Pcp, No   Patient coming from: Tx from [facility] ED    Chief Complaint:  Chief Complaint  Patient presents with   Abdominal Pain    HPI:  Jose Carter is a 48 y.o. male with CKD stage 2-3, on PrEP who presented with acute onset of abd pain, N/V x 1 day. Reports that earlier in the day he ate some [employer] that did not taste right. Then later in the evening he was going to a party and on the way there had worsening abdominal pain. Started coming in waves, and associated with nausea. Then developed vomiting and continued severe waves of abd pain. Up to 8/10. He reports abd distension, belching. He took 2 dulcolax to try to have a BM, reports having very small hard BM last night. Has not passed any gas since last night. No vomiting since prior to ED arrival. No hx abd surgeries. No chronic GI symptoms      Review of Systems:  ROS complete and negative except as marked above   Allergies  Allergen Reactions   Sustiva [Efavirenz] Swelling    Pt states he was told by prison physician to list Sustiva as an allergy.     Prior to Admission medications   Medication Sig Start Date End Date Taking? Authorizing Provider  cabotegravir  & rilpivirine  ER (CABENUVA ) 600 & 900 MG/3ML injection Inject 1 kit into the muscle every 2 (two) months. 06/06/24   Kuppelweiser, Cassie L, RPH-CPP  doxycycline  (VIBRA -TABS) 100 MG tablet Take 2 tablets (200 mg) by mouth 24-72 hours after sex 07/05/24   Kuppelweiser, Cassie L, RPH-CPP  OVER THE COUNTER MEDICATION Takes intek revolution and berbertrim supplements    [provider]    Past Medical History:  Diagnosis Date   HIV infection (HCC)    Hyperlipidemia 05/09/2024    Past Surgical History:  Procedure Laterality Date   COLONOSCOPY     2008 or 2009 per pt     reports that he quit smoking about 4 years ago. His smoking use included cigarettes. He started  smoking about 11 years ago. He has a 3.5 pack-year smoking history. He has never used smokeless tobacco. He reports current alcohol use. He reports that he does not currently use drugs after having used the following drugs: Cocaine and Marijuana.  Family History  Problem Relation Age of Onset   Colon cancer Neg Hx    Rectal cancer Neg Hx    Stomach cancer Neg Hx    Esophageal cancer Neg Hx      Physical Exam: Vitals:   08/27/24 0208 08/27/24 0216 08/27/24 0300 08/27/24 0405  BP: 129/87  120/72   Pulse: 73  68   Resp:      Temp:  (!) 97.4 F (36.3 C)  97.9 F (36.6 C)  TempSrc:  Temporal  Oral  SpO2: 100%  98%     Gen: Awake, alert, NAD   CV: Regular, normal S1, S2, no murmurs  Resp: Normal WOB, CTAB  Abd: Flat, hypoactive, moderate tenderness in the RLQ without rebound, guarding, rigidity  MSK: Symmetric, no edema  Skin: No rashes or lesions to exposed skin  Neuro: Alert and interactive  Psych: euthymic, appropriate    Data review:   Labs reviewed, notable for:   Cr 1.42 ~ b/l  WBC 10   Micro:  Results for orders placed or performed in visit on 05/10/24  Urine Culture     Status: None   Collection Time: 05/10/24  4:41 PM   Specimen: Urine  Result Value Ref Range Status   MICRO NUMBER: [id number]  Final   SPECIMEN QUALITY: Adequate  Final   Sample Source URINE  Final   STATUS: FINAL  Final   Result: No Growth  Final    Imaging reviewed:  CT ABDOMEN PELVIS W CONTRAST Result Date: 08/27/2024 EXAM: CT ABDOMEN AND PELVIS WITH CONTRAST 08/27/2024 03:00:00 AM TECHNIQUE: CT of the abdomen and pelvis was performed with the administration of intravenous contrast. Multiplanar reformatted images are provided for review. Automated exposure control, iterative reconstruction, and/or weight-based adjustment of the mA/kV was utilized to reduce the radiation dose to as low as reasonably achievable. COMPARISON: None available. CLINICAL HISTORY: Abdominal pain, acute, nonlocalized.  Pt coming from home with abdominal pain with nausea and vomiting that started at 2200 last night. FINDINGS: LOWER CHEST: No acute abnormality. LIVER: The liver is unremarkable. GALLBLADDER AND BILE DUCTS: Gallbladder is unremarkable. No biliary ductal dilatation. SPLEEN: No acute abnormality. PANCREAS: No acute abnormality. ADRENAL GLANDS: No acute abnormality. KIDNEYS, URETERS AND BLADDER: No stones in the kidneys or ureters. No hydronephrosis. No perinephric or periureteral stranding. Urinary bladder is unremarkable. GI AND BOWEL: Scattered segments of small bowel wall thickening and mucosal hyperenhancement, for example, on series 6 image 43 in the right hemiabdomen and in the low central abdomen on series 6 image 54. There is mild upstream fluid-filled distention of the small bowel with an abrupt transition point in the area of inflammation in the right abdomen. Question mucosal hyperenhancement diffusely throughout the colon versus under distention. PERITONEUM AND RETROPERITONEUM: No ascites. No free air. VASCULATURE: Aorta is normal in caliber. LYMPH NODES: No lymphadenopathy. REPRODUCTIVE ORGANS: No acute abnormality. BONES AND SOFT TISSUES: No acute osseous abnormality. Injection granulomas in the gluteal soft tissues. IMPRESSION: 1. Findings favor enterocolitis with associated ileus. Partial or early obstruction at an area of inflammation in the small bowel in the right abdomen is not excluded. Electronically signed by: Norman Gatlin MD 08/27/2024 03:15 AM EDT RP Workstation: [id number]    EKG: Pending   ED Course:  Treated with 1 L NS, zofran , dilaudid      Assessment/Plan:  48 y.o. male with hx hx of HIV, who presented with acute onset of abd pain, N/V x 1 day. Found to have imaging findings suggestive of enterocolitis, likely with associated ileus / partial obstruction.   Enterocolitis  Likely associated ileus  v. early/partial obstruction  p/w acute onset N/V, abd distension. Borderline  obstipation. WBC 10. Imaging with enterocolitis with likely ileus, partial / early obstruction at area of inflammation in the SB in mid right abdomen not excluded.  -- Routine general surgery consult, messaged Gen surg pool  -- For now keep NPO with ice chips/sip with meds  -- Start on Ceftriaxone  2 g IV q 24 hr, Flagyl  500 mg IV BID.  -- mIVF LR at 100 cc/hr -- symptom mgmt zofran , compazine  prn   Chronic medical problems:  CKD stage II-III: Advised that he f/u with primary care for this. Cr stable at 1.4  PrEP: Is on Cabenuva  injectable outpatient   There is no height or weight on file to calculate BMI.    DVT prophylaxis:  SCDs Code Status:  Full Code Diet:  Diet Orders (From admission, onward)    None      Family Communication:  None   Consults:  General surgery   Admission status:  Observation, Telemetry bed  Severity of Illness: The appropriate patient status for this patient is OBSERVATION. Observation status is judged to be reasonable and necessary in order to provide the required intensity of service to ensure the patient's safety. The patient's presenting symptoms, physical exam findings, and initial radiographic and laboratory data in the context of their medical condition is felt to place them at decreased risk for further clinical deterioration. Furthermore, it is anticipated that the patient will be medically stable for discharge from the hospital within 2 midnights of admission.    Dorn Dawson, MD [facility]  How to contact the [facility] Attending or Consulting provider 7A - 7P or covering provider during after hours 7P -7A, for this patient.  Check the care team in [facility] and look for a) attending/consulting [facility] provider listed and b) the [facility] team listed Log into [email or web address] and use [facility] universal password to access. If you do not have the password, please contact the hospital operator. Locate the [facility] provider you are looking for under [facility] and page to a number that you can be directly reached. If you still have difficulty reaching the provider, please page the [facility] (Director on Call) for the Hospitalists listed on amion for assistance.  08/27/2024, 6:02 AM

## 2024-08-28 ENCOUNTER — Observation Stay (HOSPITAL_COMMUNITY)

## 2024-08-28 ENCOUNTER — Other Ambulatory Visit (HOSPITAL_COMMUNITY): Payer: Self-pay

## 2024-08-28 DIAGNOSIS — K567 Ileus, unspecified: Secondary | ICD-10-CM | POA: Diagnosis not present

## 2024-08-28 DIAGNOSIS — K529 Noninfective gastroenteritis and colitis, unspecified: Secondary | ICD-10-CM | POA: Diagnosis not present

## 2024-08-28 DIAGNOSIS — R112 Nausea with vomiting, unspecified: Secondary | ICD-10-CM | POA: Diagnosis not present

## 2024-08-28 DIAGNOSIS — R1084 Generalized abdominal pain: Secondary | ICD-10-CM | POA: Diagnosis not present

## 2024-08-28 DIAGNOSIS — R933 Abnormal findings on diagnostic imaging of other parts of digestive tract: Secondary | ICD-10-CM | POA: Diagnosis not present

## 2024-08-28 LAB — COMPREHENSIVE METABOLIC PANEL WITH GFR
ALT: 22 U/L (ref 0–44)
AST: 26 U/L (ref 15–41)
Albumin: 3.3 g/dL — ABNORMAL LOW (ref 3.5–5.0)
Alkaline Phosphatase: 38 U/L (ref 38–126)
Anion gap: 10 (ref 5–15)
BUN: 9 mg/dL (ref 6–20)
CO2: 25 mmol/L (ref 22–32)
Calcium: 8.7 mg/dL — ABNORMAL LOW (ref 8.9–10.3)
Chloride: 103 mmol/L (ref 98–111)
Creatinine, Ser: 1.41 mg/dL — ABNORMAL HIGH (ref 0.61–1.24)
GFR, Estimated: >60 mL/min (ref >60)
Glucose, Bld: 93 mg/dL (ref 70–99)
Potassium: 3.7 mmol/L (ref 3.5–5.1)
Sodium: 138 mmol/L (ref 135–145)
Total Bilirubin: 0.9 mg/dL (ref 0.0–1.2)
Total Protein: 6.3 g/dL — ABNORMAL LOW (ref 6.5–8.1)

## 2024-08-28 LAB — CBC
HCT: 34.5 % — ABNORMAL LOW (ref 39.0–52.0)
Hemoglobin: 11.7 g/dL — ABNORMAL LOW (ref 13.0–17.0)
MCH: 27.1 pg (ref 26.0–34.0)
MCHC: 33.9 g/dL (ref 30.0–36.0)
MCV: 80 fL (ref 80.0–100.0)
Platelets: 235 K/uL (ref 150–400)
RBC: 4.31 MIL/uL (ref 4.22–5.81)
RDW: 13.6 % (ref 11.5–15.5)
WBC: 4.5 K/uL (ref 4.0–10.5)
nRBC: 0 % (ref 0.0–0.2)

## 2024-08-28 LAB — PHOSPHORUS: Phosphorus: 3.4 mg/dL (ref 2.5–4.6)

## 2024-08-28 LAB — MAGNESIUM: Magnesium: 2 mg/dL (ref 1.7–2.4)

## 2024-08-28 MED ORDER — METRONIDAZOLE 500 MG PO TABS: 500.0000 mg | ORAL_TABLET | Freq: Three times a day (TID) | ORAL | 0 refills | Status: AC

## 2024-08-28 MED ORDER — LACTATED RINGERS IV SOLN: INTRAVENOUS | Status: DC

## 2024-08-28 MED ORDER — CIPROFLOXACIN HCL 500 MG PO TABS
500.0000 mg | ORAL_TABLET | Freq: Two times a day (BID) | ORAL | 0 refills | Status: AC
Start: 2024-08-28 — End: 2024-08-31

## 2024-08-28 NOTE — Hospital Course (Addendum)
 Jose Carter is a 47 y.o. male with past medical history of CKD stage 2-3 who presented to the hospital with acute onset of abd pain, and nausea vomiting for 1 day. Reported that earlier in the day he ate some [employer] that did not taste right. Then later in the evening he was going to a party and on the way there had worsening abdominal pain and spasms and subsequent had nausea and vomiting.  Patient took took 2 dulcolax to try to have a BM, reports having very small hard BM last night. Has not passed any gas since last night.   Acute enterocolitis Joan associated ileus  v. early/partial obstruction  Patient presented with nausea vomiting abdominal distention.  Imaging showed some enterocolitis with possible likely ileus or early obstruction.  General surgery was consulted.  At this time patient has been on clears.  Continue IV fluids Rocephin  and Flagyl  and Zofran ..  No leukocytosis.     CKD stage II-III: Creatinine on presentation at 1.4.  Has been stable.  Will continue to monitor closely.   History of HIV.  Is on Cabenuva  injectable outpatient.  Last HIV RNA was less than 20 on 07/05/2024.

## 2024-08-28 NOTE — Progress Notes (Signed)
 [facility] Progress Note     Subjective: CC:   Denies abdominal pain - says pain he had in RLQ and epigastric region are now gone. +flatus. Reports 4, loose, non-bloody stools in the last 24 hours.  Objective: Vital signs in last 24 hours: Temp:  [97.1 F (36.2 C)-98.2 F (36.8 C)] 97.7 F (36.5 C) (09/08 0849) Pulse Rate:  [60-66] 66 (09/08 0403) Resp:  [12-20] 20 (09/08 0849) BP: (118-161)/(68-100) 161/97 (09/08 0849) SpO2:  [97 %-99 %] 99 % (09/08 0403) Last BM Date : 08/27/24  Intake/Output from previous day: 09/07 0701 - 09/08 0700 In: 1560.2 [P.O.:240; I.V.:1120.2; IV Piggyback:200] Out: 325 [Urine:325] Intake/Output this shift: No intake/output data recorded.  PE: Gen:  Alert, NAD, pleasant Card:  Regular rate and rhythm Pulm:  Normal effort ORA Abd: Soft, non-tender, non-distended Skin: warm and dry, no rashes  Psych: A&Ox3   Lab Results:  Recent Labs    08/27/24 0203 08/28/24 0455  WBC 10.1 4.5  HGB 13.5 11.7*  HCT 39.4 34.5*  PLT 280 235   BMET Recent Labs    08/27/24 0203 08/28/24 0455  NA 138 138  K 4.3 3.7  CL 103 103  CO2 22 25  GLUCOSE 113* 93  BUN 24* 9  CREATININE 1.42* 1.41*  CALCIUM 9.3 8.7*   PT/INR No results for input(s): LABPROT, INR in the last 72 hours. CMP     Component Value Date/Time   NA 138 08/28/2024 0455   K 3.7 08/28/2024 0455   CL 103 08/28/2024 0455   CO2 25 08/28/2024 0455   GLUCOSE 93 08/28/2024 0455   BUN 9 08/28/2024 0455   CREATININE 1.41 (H) 08/28/2024 0455   CREATININE 1.37 (H) 09/14/2023 0921   CALCIUM 8.7 (L) 08/28/2024 0455   PROT 6.3 (L) 08/28/2024 0455   ALBUMIN 3.3 (L) 08/28/2024 0455   AST 26 08/28/2024 0455   ALT 22 08/28/2024 0455   ALKPHOS 38 08/28/2024 0455   BILITOT 0.9 08/28/2024 0455   GFRNONAA >60 08/28/2024 0455   GFRNONAA 49 (L) 10/16/2020 1048   GFRAA 57 (L) 10/16/2020 1048   Lipase     Component Value Date/Time   LIPASE 43 08/27/2024 0203        Studies/Results: DG Abd Portable 2V Result Date: 08/28/2024 CLINICAL DATA:  [id number].  Ileus due to infection. EXAM: PORTABLE ABDOMEN - 2 VIEW COMPARISON:  CT abdomen and pelvis yesterday. FINDINGS: Flat and upright abdomen, two views 5:09 a.m.: There is a mildly dilated left mid abdominal small bowel segment measuring 3.1 cm. Other similarly dilated small bowel in the upper to mid abdomen on CT was fluid-filled and would not be well seen if still dilated. Scattered gas and stool continue to be noted throughout the colon to the rectum. There are scattered fluid levels in the colon. No free air is seen.  No calculus or other significant findings. IMPRESSION: 1. Mildly dilated left mid abdominal small bowel segment measuring 3.1 cm. Other similarly dilated small bowel in the upper to mid abdomen on CT was fluid-filled and would not be well seen if still dilated. 2. Scattered gas and stool in the colon with scattered fluid levels. Electronically Signed   By: Francis Quam M.D.   On: 08/28/2024 06:19   CT ABDOMEN PELVIS W CONTRAST Result Date: 08/27/2024 EXAM: CT ABDOMEN AND PELVIS WITH CONTRAST 08/27/2024 03:00:00 AM TECHNIQUE: CT of the abdomen and pelvis was performed with the administration of intravenous contrast. Multiplanar reformatted images are provided  for review. Automated exposure control, iterative reconstruction, and/or weight-based adjustment of the mA/kV was utilized to reduce the radiation dose to as low as reasonably achievable. COMPARISON: None available. CLINICAL HISTORY: Abdominal pain, acute, nonlocalized. Pt coming from home with abdominal pain with nausea and vomiting that started at 2200 last night. FINDINGS: LOWER CHEST: No acute abnormality. LIVER: The liver is unremarkable. GALLBLADDER AND BILE DUCTS: Gallbladder is unremarkable. No biliary ductal dilatation. SPLEEN: No acute abnormality. PANCREAS: No acute abnormality. ADRENAL GLANDS: No acute abnormality. KIDNEYS, URETERS AND  BLADDER: No stones in the kidneys or ureters. No hydronephrosis. No perinephric or periureteral stranding. Urinary bladder is unremarkable. GI AND BOWEL: Scattered segments of small bowel wall thickening and mucosal hyperenhancement, for example, on series 6 image 43 in the right hemiabdomen and in the low central abdomen on series 6 image 54. There is mild upstream fluid-filled distention of the small bowel with an abrupt transition point in the area of inflammation in the right abdomen. Question mucosal hyperenhancement diffusely throughout the colon versus under distention. PERITONEUM AND RETROPERITONEUM: No ascites. No free air. VASCULATURE: Aorta is normal in caliber. LYMPH NODES: No lymphadenopathy. REPRODUCTIVE ORGANS: No acute abnormality. BONES AND SOFT TISSUES: No acute osseous abnormality. Injection granulomas in the gluteal soft tissues. IMPRESSION: 1. Findings favor enterocolitis with associated ileus. Partial or early obstruction at an area of inflammation in the small bowel in the right abdomen is not excluded. Electronically signed by: Norman Gatlin MD 08/27/2024 03:15 AM EDT RP Workstation: [id number]    Anti-infectives: Anti-infectives (From admission, onward)    Start     Dose/Rate Route Frequency Ordered Stop   08/27/24 0700  cefTRIAXone  (ROCEPHIN ) 2 g in sodium chloride  0.9 % 100 mL IVPB        2 g 200 mL/hr over 30 Minutes Intravenous Every 24 hours 08/27/24 0610     08/27/24 0700  metroNIDAZOLE  (FLAGYL ) IVPB 500 mg        500 mg 100 mL/hr over 60 Minutes Intravenous Every 12 hours 08/27/24 0610          Assessment/Plan Abdominal pain, nausea, vomiting with dilated segments of small bowel with mucosal enhancement   Afebrile, VSS, WBC WNL  This likely represents enterocolitis with secondary ileus. Pain is improving with supportive care and antibiotics. Clinically he is not obstructed. KUB today with minimal dilation of a small bowel loop and air throughout the colon.  Tolerating CLD. Ok to advance to FLD and then as tolerated. No acute surgical needs. We will sign off.   If he develops severe RLQ pain, nausea, fever, or other clinical evidence of appendicitis then please call us  back.     LOS: 0 days   I reviewed nursing notes, hospitalist notes, last 24 h vitals and pain scores, last 48 h intake and output, last 24 h labs and trends, and last 24 h imaging results.  This care required straight-forward level of medical decision making.   Almarie Pringle, PA-C [facility] Please see Amion for pager number during day hours 7:00am-4:30pm

## 2024-08-28 NOTE — Discharge Summary (Addendum)
 Physician Discharge Summary  Jose Carter FMW:[id number] DOB: 28-Aug-1976 DOA: 08/27/2024  PCP: Pcp, No  Admit date: 08/27/2024 Discharge date: 08/28/2024  Admitted From: Home  Discharge disposition: Home   Recommendations for Outpatient Follow-Up:   Follow up with your primary care provider in one week.  Check CBC, BMP, magnesium in the next visit   Discharge Diagnosis:   Principal Problem:   Enterocolitis Active Problems:   HIV disease (HCC)   Discharge Condition: Improved.  Diet recommendation: .  Regular.  Wound care: None.  Code status: Full.   History of Present Illness:   Jose Carter is a 48 y.o. male with past medical history of CKD stage 2-3 who presented to the hospital with acute onset of abd pain, and nausea vomiting for 1 day. Reported that earlier in the day he ate some [employer] that did not taste right. Then later in the evening he was going to a party and on the way there had worsening abdominal pain and spasms and subsequent had nausea and vomiting.  Patient took took 2 dulcolax to try to have a BM, reports having very small hard BM last night.  Patient then presented to hospital for further evaluation and treatment.   Hospital Course:   Following conditions were addressed during hospitalization as listed below,  Acute enterocolitis  Patient presented with nausea vomiting abdominal distention.  Imaging showed some enterocolitis with possible likely ileus or early obstruction.  General surgery was consulted and at this time no concerns for obstruction.  Patient has spontaneously had bowel movements and has tolerated oral diet.  Due to findings of enterocolitis patient was started on antibiotics which will be continued for next 3 days to complete the course.  At this time patient has tolerated oral diet and is stable for disposition home.    CKD stage II: Creatinine on presentation at 1.4.  Has been stable.  Monitor as outpatient.   History of HIV.  Is  on Cabenuva  injectable outpatient.  Last HIV RNA was less than 20 on 07/05/2024.  Disposition.  At this time, patient is stable for disposition home with outpatient PCP follow-up.  Medical Consultants:   General surgery  Procedures:    None Subjective:   Today, patient was seen and examined at bedside.  Denies any nausea, vomiting, fever, chills or rigor.  Has tolerated oral diet.  Discharge Exam:   Vitals:   08/28/24 0403 08/28/24 0849  BP: 131/80 (!) 161/97  Pulse: 66   Resp: 17 20  Temp: 98.2 F (36.8 C) 97.7 F (36.5 C)  SpO2: 99%    Vitals:   08/27/24 1946 08/27/24 2353 08/28/24 0403 08/28/24 0849  BP: (!) 160/99 (!) 141/100 131/80 (!) 161/97  Pulse: 60 62 66   Resp: 12 16 17 20   Temp: 98.1 F (36.7 C) 98 F (36.7 C) 98.2 F (36.8 C) 97.7 F (36.5 C)  TempSrc: Oral Oral Oral Oral  SpO2: 99% 97% 99%   Weight:      Height:       Body mass index is 34.43 kg/m.   General: Alert awake, not in obvious distress, obese built HENT: pupils equally reacting to light,  No scleral pallor or icterus noted. Oral mucosa is moist.  Chest:  Diminished breath sounds bilaterally. No crackles or wheezes.  CVS: S1 &S2 heard. No murmur.  Regular rate and rhythm. Abdomen: Soft, nontender, nondistended.  Bowel sounds are heard.   Extremities: No cyanosis, clubbing or edema.  Peripheral  pulses are palpable. Psych: Alert, awake and oriented, normal mood CNS:  No cranial nerve deficits.  Power equal in all extremities.   Skin: Warm and dry.  No rashes noted.  The results of significant diagnostics from this hospitalization (including imaging, microbiology, ancillary and laboratory) are listed below for reference.     Diagnostic Studies:   DG Abd Portable 2V Result Date: 08/28/2024 CLINICAL DATA:  [id number].  Ileus due to infection. EXAM: PORTABLE ABDOMEN - 2 VIEW COMPARISON:  CT abdomen and pelvis yesterday. FINDINGS: Flat and upright abdomen, two views 5:09 a.m.: There is a mildly  dilated left mid abdominal small bowel segment measuring 3.1 cm. Other similarly dilated small bowel in the upper to mid abdomen on CT was fluid-filled and would not be well seen if still dilated. Scattered gas and stool continue to be noted throughout the colon to the rectum. There are scattered fluid levels in the colon. No free air is seen.  No calculus or other significant findings. IMPRESSION: 1. Mildly dilated left mid abdominal small bowel segment measuring 3.1 cm. Other similarly dilated small bowel in the upper to mid abdomen on CT was fluid-filled and would not be well seen if still dilated. 2. Scattered gas and stool in the colon with scattered fluid levels. Electronically Signed   By: Francis Quam M.D.   On: 08/28/2024 06:19   CT ABDOMEN PELVIS W CONTRAST Result Date: 08/27/2024 EXAM: CT ABDOMEN AND PELVIS WITH CONTRAST 08/27/2024 03:00:00 AM TECHNIQUE: CT of the abdomen and pelvis was performed with the administration of intravenous contrast. Multiplanar reformatted images are provided for review. Automated exposure control, iterative reconstruction, and/or weight-based adjustment of the mA/kV was utilized to reduce the radiation dose to as low as reasonably achievable. COMPARISON: None available. CLINICAL HISTORY: Abdominal pain, acute, nonlocalized. Pt coming from home with abdominal pain with nausea and vomiting that started at 2200 last night. FINDINGS: LOWER CHEST: No acute abnormality. LIVER: The liver is unremarkable. GALLBLADDER AND BILE DUCTS: Gallbladder is unremarkable. No biliary ductal dilatation. SPLEEN: No acute abnormality. PANCREAS: No acute abnormality. ADRENAL GLANDS: No acute abnormality. KIDNEYS, URETERS AND BLADDER: No stones in the kidneys or ureters. No hydronephrosis. No perinephric or periureteral stranding. Urinary bladder is unremarkable. GI AND BOWEL: Scattered segments of small bowel wall thickening and mucosal hyperenhancement, for example, on series 6 image 43 in the  right hemiabdomen and in the low central abdomen on series 6 image 54. There is mild upstream fluid-filled distention of the small bowel with an abrupt transition point in the area of inflammation in the right abdomen. Question mucosal hyperenhancement diffusely throughout the colon versus under distention. PERITONEUM AND RETROPERITONEUM: No ascites. No free air. VASCULATURE: Aorta is normal in caliber. LYMPH NODES: No lymphadenopathy. REPRODUCTIVE ORGANS: No acute abnormality. BONES AND SOFT TISSUES: No acute osseous abnormality. Injection granulomas in the gluteal soft tissues. IMPRESSION: 1. Findings favor enterocolitis with associated ileus. Partial or early obstruction at an area of inflammation in the small bowel in the right abdomen is not excluded. Electronically signed by: Norman Gatlin MD 08/27/2024 03:15 AM EDT RP Workstation: [id number]     Labs:   Basic Metabolic Panel: Recent Labs  Lab 08/27/24 0203 08/28/24 0455  NA 138 138  K 4.3 3.7  CL 103 103  CO2 22 25  GLUCOSE 113* 93  BUN 24* 9  CREATININE 1.42* 1.41*  CALCIUM 9.3 8.7*  MG  --  2.0  PHOS  --  3.4   GFR Estimated Creatinine Clearance:  88.2 mL/min (A) (by C-G formula based on SCr of 1.41 mg/dL (H)). Liver Function Tests: Recent Labs  Lab 08/27/24 0203 08/28/24 0455  AST 36 26  ALT 26 22  ALKPHOS 66 38  BILITOT 0.2 0.9  PROT 7.6 6.3*  ALBUMIN 4.5 3.3*   Recent Labs  Lab 08/27/24 0203  LIPASE 43   No results for input(s): AMMONIA in the last 168 hours. Coagulation profile No results for input(s): INR, PROTIME in the last 168 hours.  CBC: Recent Labs  Lab 08/27/24 0203 08/28/24 0455  WBC 10.1 4.5  NEUTROABS 5.7  --   HGB 13.5 11.7*  HCT 39.4 34.5*  MCV 79.4* 80.0  PLT 280 235   Cardiac Enzymes: No results for input(s): CKTOTAL, CKMB, CKMBINDEX, TROPONINI in the last 168 hours. BNP: Invalid input(s): POCBNP CBG: No results for input(s): GLUCAP in the last 168  hours. D-Dimer No results for input(s): DDIMER in the last 72 hours. Hgb A1c No results for input(s): HGBA1C in the last 72 hours. Lipid Profile No results for input(s): CHOL, HDL, LDLCALC, TRIG, CHOLHDL, LDLDIRECT in the last 72 hours. Thyroid function studies No results for input(s): TSH, T4TOTAL, T3FREE, THYROIDAB in the last 72 hours.  Invalid input(s): FREET3 Anemia work up No results for input(s): VITAMINB12, FOLATE, FERRITIN, TIBC, IRON, RETICCTPCT in the last 72 hours. Microbiology No results found for this or any previous visit (from the past 240 hours).   Discharge Instructions:   Discharge Instructions     Diet - low sodium heart healthy   Complete by: As directed    Discharge instructions   Complete by: As directed    Follow-up with your primary care provider in 1 week.  Check blood work at that time.  Seek medical attention for worsening symptoms.  Increase hydration.  Complete the course of antibiotic.   Increase activity slowly   Complete by: As directed       Allergies as of 08/28/2024       Reactions   Sustiva [efavirenz] Swelling   Pt states he was told by prison physician to list Sustiva as an allergy.         Medication List     TAKE these medications    cabotegravir  & rilpivirine  ER 600 & 900 MG/3ML injection Commonly known as: CABENUVA  Inject 1 kit into the muscle every 2 (two) months.   ciprofloxacin  500 MG tablet Commonly known as: Cipro  Take 1 tablet (500 mg total) by mouth 2 (two) times daily for 3 days.   CREATINE PO Take 1 Dose by mouth daily.   doxycycline  100 MG tablet Commonly known as: VIBRA -TABS Take 2 tablets (200 mg) by mouth 24-72 hours after sex   metroNIDAZOLE  500 MG tablet Commonly known as: FLAGYL  Take 1 tablet (500 mg total) by mouth 3 (three) times daily for 3 days.   OVER THE COUNTER MEDICATION Take 1 tablet by mouth daily. Cardarine   OVER THE COUNTER MEDICATION Take 1  tablet by mouth daily. Thermogenic Weight Loss Supplement. Clean Plus   PROBIOTIC PO Take 1 Dose by mouth daily as needed.        Follow-up Information     primary care provider Follow up in 1 week(s).                   Time coordinating discharge: 39 minutes  Signed:  Lemmie Steinhaus  [facility] 08/28/2024, 4:14 PM

## 2024-08-28 NOTE — Progress Notes (Signed)
 DISCHARGE NOTE HOME Jose Carter to be discharged Home per MD order. Discussed prescriptions and follow up appointments with the patient. Prescriptions given to patient; medication list explained in detail. Patient verbalized understanding.  Skin clean, dry and intact without evidence of skin break down, no evidence of skin tears noted. IV catheter discontinued intact. Site without signs and symptoms of complications. Dressing and pressure applied. Pt denies pain at the site currently. No complaints noted.  Patient free of lines, drains, and wounds.   An After Visit Summary (AVS) was printed and given to the patient. Patient escorted via wheelchair, and discharged home via private auto.  Jose SHAUNNA Pepper, RN

## 2024-09-05 ENCOUNTER — Encounter: Payer: Self-pay | Admitting: Infectious Disease

## 2024-09-05 DIAGNOSIS — K567 Ileus, unspecified: Secondary | ICD-10-CM | POA: Insufficient documentation

## 2024-09-05 NOTE — Progress Notes (Unsigned)
   Subjective:  Chief complaint: followup for HIV on Q2M Cabenuva    Patient ID: Jose Carter, male    DOB: Dec 12, 1976, 48 y.o.   MRN: [id number]  HPI  Past Medical History:  Diagnosis Date   HIV infection (HCC)    Hyperlipidemia 05/09/2024    Past Surgical History:  Procedure Laterality Date   COLONOSCOPY     2008 or 2009 per pt    Family History  Problem Relation Age of Onset   Colon cancer Neg Hx    Rectal cancer Neg Hx    Stomach cancer Neg Hx    Esophageal cancer Neg Hx       Social History   Socioeconomic History   Marital status: Single    Spouse name: Not on file   Number of children: Not on file   Years of education: Not on file   Highest education level: Not on file  Occupational History   Not on file  Tobacco Use   Smoking status: Former    Current packs/day: 0.00    Average packs/day: 0.5 packs/day for 6.9 years (3.5 ttl pk-yrs)    Types: Cigarettes    Start date: 01/10/2013    Quit date: 12/22/2019    Years since quitting: 4.7   Smokeless tobacco: Never  Vaping Use   Vaping status: Never Used  Substance and Sexual Activity   Alcohol use: Yes    Comment: wine   Drug use: Not Currently    Types: Cocaine, Marijuana    Comment: 2-3 times a week; 56 days drug/alcohol free 10/28/20   Sexual activity: Not Currently    Partners: Male    Birth control/protection: Condom    Comment: ACCEPTED CONDOMS  Other Topics Concern   Not on file  Social History Narrative   Not on file   Social Drivers of Health   Financial Resource Strain: Not on file  Food Insecurity: No Food Insecurity (08/27/2024)   Hunger Vital Sign    Worried About Running Out of Food in the Last Year: Never true    Ran Out of Food in the Last Year: Never true  Transportation Needs: No Transportation Needs (08/27/2024)   PRAPARE - Administrator, Civil Service (Medical): No    Lack of Transportation (Non-Medical): No  Physical Activity: Not on file  Stress: Not on file   Social Connections: Not on file    Allergies  Allergen Reactions   Sustiva [Efavirenz] Swelling    Pt states he was told by prison physician to list Sustiva as an allergy.      Current Outpatient Medications:    cabotegravir  & rilpivirine  ER (CABENUVA ) 600 & 900 MG/3ML injection, Inject 1 kit into the muscle every 2 (two) months., Disp: , Rfl:    CREATINE PO, Take 1 Dose by mouth daily., Disp: , Rfl:    doxycycline  (VIBRA -TABS) 100 MG tablet, Take 2 tablets (200 mg) by mouth 24-72 hours after sex, Disp: 30 tablet, Rfl: 2   OVER THE COUNTER MEDICATION, Take 1 tablet by mouth daily. Cardarine, Disp: , Rfl:    OVER THE COUNTER MEDICATION, Take 1 tablet by mouth daily. Thermogenic Weight Loss Supplement. Clean Plus, Disp: , Rfl:    Probiotic Product (PROBIOTIC PO), Take 1 Dose by mouth daily as needed., Disp: , Rfl:    Review of Systems     Objective:   Physical Exam        Assessment & Plan:

## 2024-09-06 ENCOUNTER — Other Ambulatory Visit: Payer: Self-pay

## 2024-09-06 ENCOUNTER — Encounter: Payer: Self-pay | Admitting: Infectious Disease

## 2024-09-06 ENCOUNTER — Ambulatory Visit (INDEPENDENT_AMBULATORY_CARE_PROVIDER_SITE_OTHER): Payer: Self-pay | Admitting: Infectious Disease

## 2024-09-06 VITALS — BP 156/83 | HR 80 | Temp 98.6°F | Ht 72.0 in | Wt 256.0 lb

## 2024-09-06 DIAGNOSIS — Z113 Encounter for screening for infections with a predominantly sexual mode of transmission: Secondary | ICD-10-CM | POA: Diagnosis not present

## 2024-09-06 DIAGNOSIS — K567 Ileus, unspecified: Secondary | ICD-10-CM

## 2024-09-06 DIAGNOSIS — B20 Human immunodeficiency virus [HIV] disease: Secondary | ICD-10-CM

## 2024-09-06 DIAGNOSIS — Z23 Encounter for immunization: Secondary | ICD-10-CM | POA: Diagnosis not present

## 2024-09-06 DIAGNOSIS — K529 Noninfective gastroenteritis and colitis, unspecified: Secondary | ICD-10-CM

## 2024-09-06 DIAGNOSIS — E785 Hyperlipidemia, unspecified: Secondary | ICD-10-CM

## 2024-09-06 MED ORDER — ATORVASTATIN CALCIUM 20 MG PO TABS: 20.0000 mg | ORAL_TABLET | Freq: Every day | ORAL | 11 refills | Status: AC

## 2024-09-06 MED ORDER — CABOTEGRAVIR & RILPIVIRINE ER 600 & 900 MG/3ML IM SUER
1.0000 | Freq: Once | INTRAMUSCULAR | Status: AC
  Administered 2024-09-06: 1 via INTRAMUSCULAR

## 2024-09-07 LAB — C. TRACHOMATIS/N. GONORRHOEAE RNA
C. trachomatis RNA, TMA: NOT DETECTED
N. gonorrhoeae RNA, TMA: NOT DETECTED

## 2024-09-08 LAB — T-HELPER CELLS (CD4) COUNT (NOT AT ARMC)
Absolute CD4: 946 {cells}/uL (ref 490–1740)
CD4 T Helper %: 34 % (ref 30–61)
Total lymphocyte count: 2780 {cells}/uL (ref 850–3900)

## 2024-09-08 LAB — HIV-1 RNA QUANT-NO REFLEX-BLD
HIV 1 RNA Quant: <20 {copies}/mL — AB
HIV-1 RNA Quant, Log: <1.3 {Log_copies}/mL — AB

## 2024-10-05 ENCOUNTER — Other Ambulatory Visit (HOSPITAL_COMMUNITY)
Admission: RE | Admit: 2024-10-05 | Discharge: 2024-10-05 | Disposition: A | Source: Ambulatory Visit | Attending: Infectious Diseases | Admitting: Infectious Diseases

## 2024-10-05 ENCOUNTER — Other Ambulatory Visit: Payer: Self-pay

## 2024-10-05 ENCOUNTER — Ambulatory Visit (INDEPENDENT_AMBULATORY_CARE_PROVIDER_SITE_OTHER): Admitting: Infectious Diseases

## 2024-10-05 VITALS — BP 148/92 | HR 74 | Temp 98.1°F | Ht 72.0 in | Wt 260.0 lb

## 2024-10-05 DIAGNOSIS — Z113 Encounter for screening for infections with a predominantly sexual mode of transmission: Secondary | ICD-10-CM | POA: Insufficient documentation

## 2024-10-05 DIAGNOSIS — J029 Acute pharyngitis, unspecified: Secondary | ICD-10-CM | POA: Diagnosis not present

## 2024-10-05 NOTE — Progress Notes (Signed)
 Subjective:     Jose Carter is a 48 y.o. male patient here today with concern over STI exposure/symptoms .    Discussed the use of AI scribe software for clinical note transcription with the patient, who gave verbal consent to proceed.  History of Present Illness   Jose Carter is a 48 year old male with HIV who presents for a sexual health check-in.  He requests a throat swab due to a sore throat that began recently after kissing and engaging in oral sexual activity. He is concerned about potential exposure to sexually transmitted infections, noting a history of gonorrhea and chlamydia, although his current symptoms do not feel the same. He also experiences occasional dysuria with a 'little light tinge' when urinating.  He has been on Cabenuva  for HIV management, with the last dose administered in September and the next dose due in November. His last blood work showed a CD4 count of 946 and an undetectable viral load. A routine urine screening at his last visit was negative for gonorrhea and chlamydia.  He mentions taking Doxy Pep as a precautionary measure, although he is new to it. He takes doxy at night and reports tolerating it well. He reports a swollen gland on one side of his neck but denies fever, chills, or any lesions or lumps in the genital or groin area.  He uses Flonase and Zyrtec for allergies, although he has not taken Zyrtec recently. He last used Flonase the previous Friday. No nasal congestion, cough, or stuffy nose, but he acknowledges possible allergy symptoms contributing to his current condition.      Review of Systems: Review of Systems  Constitutional:  Negative for chills, fatigue and fever.  HENT:  Positive for congestion (intermittent) and sore throat. Negative for mouth sores, sinus pressure and sinus pain.   Eyes:  Negative for visual disturbance.  Gastrointestinal:  Negative for abdominal pain, anal bleeding and rectal pain.  Genitourinary:   Negative for dysuria, genital sores, penile discharge, penile pain, scrotal swelling and testicular pain.  Musculoskeletal:  Negative for arthralgias and joint swelling.  Skin:  Negative for rash.  Neurological:  Negative for headaches.  Hematological:  Negative for adenopathy.    Sexual History:  Prefers male partners, oropharyngeal, rectal and penile sites exposed \  Past Medical History:  Diagnosis Date   HIV infection (HCC)    Hyperlipidemia 05/09/2024   Ileus (HCC) 09/05/2024    Outpatient Medications Prior to Visit  Medication Sig Dispense Refill   atorvastatin  (LIPITOR) 20 MG tablet Take 1 tablet (20 mg total) by mouth daily. 30 tablet 11   cabotegravir  & rilpivirine  ER (CABENUVA ) 600 & 900 MG/3ML injection Inject 1 kit into the muscle every 2 (two) months.     CREATINE PO Take 1 Dose by mouth daily.     doxycycline  (VIBRA -TABS) 100 MG tablet Take 2 tablets (200 mg) by mouth 24-72 hours after sex 30 tablet 2   OVER THE COUNTER MEDICATION Take 1 tablet by mouth daily. Cardarine     OVER THE COUNTER MEDICATION Take 1 tablet by mouth daily. Thermogenic Weight Loss Supplement. Clean Plus     Probiotic Product (PROBIOTIC PO) Take 1 Dose by mouth daily as needed.     No facility-administered medications prior to visit.    Allergies  Allergen Reactions   Sustiva [Efavirenz] Swelling    Pt states he was told by prison physician to list Sustiva as an allergy.  Family History  Problem Relation Age of Onset   Colon cancer Neg Hx    Rectal cancer Neg Hx    Stomach cancer Neg Hx    Esophageal cancer Neg Hx         Objective:  Objective  Vitals:   10/05/24 1300  BP: (!) 148/92  Pulse: 74  Temp: 98.1 F (36.7 C)  SpO2: 98%   Body mass index is 35.26 kg/m.  Physical Exam Constitutional:      Appearance: Normal appearance. He is not ill-appearing.  HENT:     Head: Normocephalic.     Mouth/Throat:     Mouth: Mucous membranes are moist. No oral lesions.      Dentition: No gum lesions.     Tongue: No lesions.     Pharynx: Oropharynx is clear. No oropharyngeal exudate, posterior oropharyngeal erythema or uvula swelling.  Eyes:     General: No scleral icterus. Cardiovascular:     Rate and Rhythm: Normal rate.  Pulmonary:     Effort: Pulmonary effort is normal.  Musculoskeletal:        General: Normal range of motion.     Cervical back: Normal range of motion.  Skin:    Coloration: Skin is not jaundiced or pale.  Neurological:     Mental Status: He is alert and oriented to person, place, and time.  Psychiatric:        Mood and Affect: Mood normal.        Judgment: Judgment normal.        Assessment & Plan:   Assessment and Plan    Sexually transmitted infection screening and risk assessment - Concerned about potential STI exposure following recent sexual activity with male partners. Reports sore throat and tingling sensation during urination, but no nasal congestion, cough, or genital lesions. History of gonorrhea and chlamydia, but current symptoms differ. Currently on DoxyPEP, acknowledging it is not 100% effective. - Perform throat swab for STI screening - Conduct rectal and urine test for gonorrhea and chlamydia - Review results on MyChart and contact if treatment is needed  Sore Throat -  Reports sore throat and unilateral swollen neck gland. No fever, chills, or systemic symptoms. Differential includes pharyngitis possibly related to allergies/post nasal drip.  - Include throat swab in STI screening to assess for possible pharyngitis - resume daily antihistamine and flonase for allergy management   HIV infection HIV well-managed with Cabenuva . Last visit on September 17th showed CD4 count of 946 and undetectable viral load. Satisfied with Cabenuva , transitioned from daily pills to injections, no issues reported.      No orders of the defined types were placed in this encounter.  Problem List Items Addressed This Visit        Unprioritized   Screening examination for venereal disease   Relevant Orders   Cytology (oral, anal, urethral) ancillary only   Cytology (oral, anal, urethral) ancillary only   Urine cytology ancillary only   Sore throat - Primary    11/07/2024 with pharmacy team for scheduled cabenuva  then follow up with Dr. Fleeta Rothman   Corean Fireman, MSN, NP-C [facility] Office: [phone] Pager: [phone]

## 2024-10-05 NOTE — Patient Instructions (Addendum)
 Nice to meet you!   Please stop by the pharmacy in our building to pick up some allergy medication  Generic names to look for on the shelf on the right side:  Allegra is fexofenadine, and for Zyrtec, it's cetirizine. Continue the flonase as you have started back doing   Will let you know what results show when they return

## 2024-10-06 ENCOUNTER — Ambulatory Visit: Payer: Self-pay | Admitting: Infectious Diseases

## 2024-10-06 LAB — CYTOLOGY, (ORAL, ANAL, URETHRAL) ANCILLARY ONLY
Chlamydia: NEGATIVE
Chlamydia: NEGATIVE
Comment: NEGATIVE
Comment: NEGATIVE
Comment: NORMAL
Comment: NORMAL
Neisseria Gonorrhea: NEGATIVE
Neisseria Gonorrhea: NEGATIVE

## 2024-10-06 LAB — URINE CYTOLOGY ANCILLARY ONLY
Chlamydia: NEGATIVE
Comment: NEGATIVE
Comment: NORMAL
Neisseria Gonorrhea: NEGATIVE

## 2024-10-07 ENCOUNTER — Emergency Department (HOSPITAL_BASED_OUTPATIENT_CLINIC_OR_DEPARTMENT_OTHER)
Admission: EM | Admit: 2024-10-07 | Discharge: 2024-10-07 | Disposition: A | Discharge status: Home / Self Care | Attending: Emergency Medicine | Admitting: Emergency Medicine

## 2024-10-07 ENCOUNTER — Encounter (HOSPITAL_BASED_OUTPATIENT_CLINIC_OR_DEPARTMENT_OTHER): Payer: Self-pay | Admitting: Emergency Medicine

## 2024-10-07 ENCOUNTER — Other Ambulatory Visit: Payer: Self-pay

## 2024-10-07 DIAGNOSIS — J039 Acute tonsillitis, unspecified: Secondary | ICD-10-CM | POA: Diagnosis not present

## 2024-10-07 DIAGNOSIS — Z21 Asymptomatic human immunodeficiency virus [HIV] infection status: Secondary | ICD-10-CM | POA: Insufficient documentation

## 2024-10-07 DIAGNOSIS — R590 Localized enlarged lymph nodes: Secondary | ICD-10-CM | POA: Diagnosis not present

## 2024-10-07 DIAGNOSIS — J029 Acute pharyngitis, unspecified: Secondary | ICD-10-CM | POA: Diagnosis not present

## 2024-10-07 MED ORDER — CEPHALEXIN 250 MG PO CAPS
500.0000 mg | ORAL_CAPSULE | Freq: Once | ORAL | Status: AC
  Administered 2024-10-07: 500 mg via ORAL
  Filled 2024-10-07: qty 2

## 2024-10-07 MED ORDER — PREDNISONE 10 MG PO TABS: 20.0000 mg | ORAL_TABLET | Freq: Two times a day (BID) | ORAL | 0 refills | Status: AC

## 2024-10-07 MED ORDER — PREDNISONE 20 MG PO TABS
40.0000 mg | ORAL_TABLET | Freq: Once | ORAL | Status: AC
  Administered 2024-10-07: 40 mg via ORAL
  Filled 2024-10-07: qty 2

## 2024-10-07 NOTE — Discharge Instructions (Signed)
 Begin taking Keflex and prednisone  as prescribed.  Take ibuprofen  600 mg every 6 hours as needed for pain.  Drink plenty of fluids and get plenty of rest.

## 2024-10-07 NOTE — ED Provider Notes (Signed)
 [facility] Provider Note   CSN: [id number] Arrival date & time: 10/07/24  [id number]     Patient presents with: Sore Throat   Jose Carter is a 48 y.o. male.   Patient is a 48 year old male with past medical history of HIV disease.  Patient presenting today for evaluation of sore throat.  He describes left-sided throat pain worsening over the past few days.  He was seen in the ID clinic and had serology performed for STDs, but were negative.  He denies fevers or chills.  Pain is worse with swallowing.  He has been taking ibuprofen  with little relief.       Prior to Admission medications   Medication Sig Start Date End Date Taking? Authorizing Provider  atorvastatin  (LIPITOR) 20 MG tablet Take 1 tablet (20 mg total) by mouth daily. 09/06/24   Fleeta Kathie Jomarie LOISE, MD  cabotegravir  & rilpivirine  ER (CABENUVA ) 600 & 900 MG/3ML injection Inject 1 kit into the muscle every 2 (two) months. 06/06/24   Kuppelweiser, Cassie L, RPH-CPP  CREATINE PO Take 1 Dose by mouth daily.    [provider]  doxycycline  (VIBRA -TABS) 100 MG tablet Take 2 tablets (200 mg) by mouth 24-72 hours after sex 07/05/24   Kuppelweiser, Cassie L, RPH-CPP  OVER THE COUNTER MEDICATION Take 1 tablet by mouth daily. Cardarine    [provider]  OVER THE COUNTER MEDICATION Take 1 tablet by mouth daily. Thermogenic Weight Loss Supplement. Clean Plus    [provider]  Probiotic Product (PROBIOTIC PO) Take 1 Dose by mouth daily as needed.    [provider]    Allergies: Sustiva [efavirenz]    Review of Systems  All other systems reviewed and are negative.   Updated Vital Signs BP (!) 146/87   Pulse 83   Temp 98.2 F (36.8 C)   Resp 18   Ht 6' (1.829 m)   Wt 117.9 kg   SpO2 99%   BMI 35.25 kg/m   Physical Exam Vitals and nursing note reviewed.  Constitutional:      Appearance: He is well-developed.  HENT:     Head:  Normocephalic and atraumatic.     Mouth/Throat:     Mouth: Mucous membranes are moist.     Tonsils: Tonsillar exudate present. No tonsillar abscesses.  Musculoskeletal:     Cervical back: Normal range of motion and neck supple.  Lymphadenopathy:     Cervical: Cervical adenopathy present.  Skin:    General: Skin is warm and dry.  Neurological:     Mental Status: He is alert.     (all labs ordered are listed, but only abnormal results are displayed) Labs Reviewed - No data to display  EKG: None  Radiology: No results found.   Procedures   Medications Ordered in the ED  predniSONE  (DELTASONE ) tablet 40 mg (has no administration in time range)  cephALEXin (KEFLEX) capsule 500 mg (has no administration in time range)                                    Medical Decision Making Risk Prescription drug management.   Patient presenting with sore throat as described in the HPI.  He does have exudates on both tonsils and tonsillar hypertrophy, but no evidence for abscess on exam.  Patient to be treated with prednisone  and Keflex.  To follow-up as needed.  Final diagnoses:  None    ED Discharge Orders     None          Geroldine Berg, MD 10/07/24 [phone]

## 2024-10-07 NOTE — ED Notes (Signed)
 ED Provider at bedside.

## 2024-10-07 NOTE — ED Triage Notes (Signed)
 Pt c/o left sided sore throat states that he was at the clinic yesterday and was swabbed, awaiting results.

## 2024-11-06 NOTE — Progress Notes (Unsigned)
 HPI: Jose Carter is a 48 y.o. male who presents to the [facility] pharmacy clinic for Cabenuva  administration.  Referring ID Provider: Corean Fireman, NP   Patient Active Problem List   Diagnosis Date Noted   Ileus (HCC) 09/05/2024   Enterocolitis 08/27/2024   Hyperlipidemia 05/09/2024   History of colon polyps 02/09/2024   Need for prophylactic vaccination and inoculation against influenza 09/14/2023   Transaminitis 03/16/2023   Sore throat 09/02/2022   Anxiety 07/21/2022   Pain and swelling of right knee 02/25/2022   Seasonal allergic conjunctivitis 06/13/2021   Frequency of urination and polyuria 10/28/2020   Weight gain 12/31/2017   Tobacco abuse 12/31/2017   Fatigue 08/04/2017   Screening examination for venereal disease 11/22/2014   Encounter for long-term (current) use of medications 11/22/2014   Rash and nonspecific skin eruption 11/22/2014   Substance abuse (HCC) 04/11/2013   Epididymitis, left 02/21/2013   Renal insufficiency 08/25/2012   Bell's palsy 08/11/2012   HIV disease (HCC) 01/14/2012   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   CABOTEGRAVIR  & RILPIVIRINE  ER (CABENUVA ) 600 & 900 MG/3ML INJECTION    Inject 1 kit into the muscle every 2 (two) months.   CEPHALEXIN (KEFLEX) 500 MG CAPSULE    Take 1 capsule (500 mg total) by mouth 4 (four) times daily.   CREATINE PO    Take 1 Dose by mouth daily.   DOXYCYCLINE  (VIBRA -TABS) 100 MG TABLET    Take 2 tablets (200 mg) by mouth 24-72 hours after sex   OVER THE COUNTER MEDICATION    Take 1 tablet by mouth daily. Cardarine   OVER THE COUNTER MEDICATION    Take 1 tablet by mouth daily. Thermogenic Weight Loss Supplement. Clean Plus   PREDNISONE  (DELTASONE ) 10 MG TABLET    Take 2 tablets (20 mg total) by mouth 2 (two) times daily.   PROBIOTIC PRODUCT  (PROBIOTIC PO)    Take 1 Dose by mouth daily as needed.  Modified Medications   No medications on file  Discontinued Medications   No medications on file    Allergies: Allergies  Allergen Reactions   Sustiva [Efavirenz] Swelling    Pt states he was told by prison physician to list Sustiva as an allergy.     Labs: Lab Results  Component Value Date   HIV1RNAQUANT <20 DETECTED (A) 09/06/2024   HIV1RNAQUANT <20 DETECTED (A) 07/05/2024   HIV1RNAQUANT <20 (H) 02/09/2024   CD4TABS 569 09/14/2023   CD4TABS 602 03/01/2023   CD4TABS 649 07/06/2022    RPR and STI Lab Results  Component Value Date   LABRPR NON-REACTIVE 07/05/2024   LABRPR NON-REACTIVE 02/09/2024   LABRPR NON-REACTIVE 09/14/2023   LABRPR NON-REACTIVE 03/01/2023   LABRPR NON-REACTIVE 04/29/2021    STI Results GC CT  10/05/2024  1:20 PM Negative    Negative  Negative    Negative   10/05/2024  1:19 PM Negative  Negative   07/05/2024  9:53 AM Negative    Negative    Negative  Negative    Negative    Negative   09/14/2023  8:48 AM Negative    Negative  Negative    Negative   09/02/2022  4:36 PM Negative    Negative  Negative    Negative   07/21/2022 11:19 AM Negative  Negative   07/06/2022 11:14 AM Negative  Negative   04/29/2021  2:22 PM Negative  Negative   04/17/2020  2:35 PM Negative  Negative   09/22/2018 12:00 AM Negative    Negative    **POSITIVE**  **POSITIVE**    Negative    Negative   06/16/2017 12:00 AM Negative  Negative     Hepatitis B Lab Results  Component Value Date   HEPBSAB INDETER (A) 12/31/2011   HEPBSAG NEGATIVE 12/31/2011   HEPBCAB NEG 12/31/2011   Hepatitis C No results found for: HEPCAB, HCVRNAPCRQN Hepatitis A Lab Results  Component Value Date   HAV REACTIVE (A) 07/05/2024   Lipids: Lab Results  Component Value Date   CHOL 181 02/09/2024   TRIG 61 02/09/2024   HDL 38 (L) 02/09/2024   CHOLHDL 4.8 02/09/2024   VLDL 24 06/16/2017   LDLCALC 128 (H)  02/09/2024    Target Date: 17th   Assessment: Jose Carter presents today for his maintenance Cabenuva  injections. Past injections were tolerated well without issues. Last HIV RNA was undetectable on 09/06/2024. Doing well with no issues today.  Lab work:  oral/urine/rectal cytologies for GC/Chlamydia, RPR ***, hepatitis B antibody   Eligible vaccinations: Covid, Shingrix 1/2  ***  Cabenuva : Administered cabotegravir  600mg /69mL in left upper outer quadrant of the gluteal muscle. Administered rilpivirine  900 mg/3mL in the right upper outer quadrant of the gluteal muscle. No issues with injections. *** will follow up in 2 months for next set of injections.  Plan: - Cabenuva  injections administered - Next injections scheduled for *** 1/12-1/23, 3/10-3/24 - Call with any issues or questions  Feliciano Close, PharmD PGY2 Infectious Diseases Pharmacy Resident

## 2024-11-07 ENCOUNTER — Other Ambulatory Visit: Payer: Self-pay

## 2024-11-07 ENCOUNTER — Telehealth: Payer: Self-pay

## 2024-11-07 ENCOUNTER — Ambulatory Visit: Payer: Self-pay | Admitting: Pharmacist

## 2024-11-07 DIAGNOSIS — B20 Human immunodeficiency virus [HIV] disease: Secondary | ICD-10-CM

## 2024-11-07 DIAGNOSIS — Z0000 Encounter for general adult medical examination without abnormal findings: Secondary | ICD-10-CM

## 2024-11-07 DIAGNOSIS — Z113 Encounter for screening for infections with a predominantly sexual mode of transmission: Secondary | ICD-10-CM

## 2024-11-07 MED ORDER — CABOTEGRAVIR & RILPIVIRINE ER 600 & 900 MG/3ML IM SUER
1.0000 | Freq: Once | INTRAMUSCULAR | Status: AC
  Administered 2024-11-07: 1 via INTRAMUSCULAR

## 2024-11-07 NOTE — Telephone Encounter (Signed)
 Attempted to contact patient regarding missed Cabenuva  injections today. Plan was to try and reschedule patient for injections. Unable to leave a voicemail.   Thank you,  Feliciano Close, PharmD PGY2 Infectious Diseases Pharmacy Resident  11/07/2024 11:11 AM

## 2025-01-01 NOTE — Progress Notes (Unsigned)
 "  HPI: Jose Carter is a 49 y.o. male who presents to the [facility] pharmacy clinic for Cabenuva  administration.  Referring ID Provider: Corean Fireman, NP  Patient Active Problem List   Diagnosis Date Noted   Ileus (HCC) 09/05/2024   Enterocolitis 08/27/2024   Hyperlipidemia 05/09/2024   History of colon polyps 02/09/2024   Need for prophylactic vaccination and inoculation against influenza 09/14/2023   Transaminitis 03/16/2023   Sore throat 09/02/2022   Anxiety 07/21/2022   Pain and swelling of right knee 02/25/2022   Seasonal allergic conjunctivitis 06/13/2021   Frequency of urination and polyuria 10/28/2020   Weight gain 12/31/2017   Tobacco abuse 12/31/2017   Fatigue 08/04/2017   Screening examination for venereal disease 11/22/2014   Encounter for long-term (current) use of medications 11/22/2014   Rash and nonspecific skin eruption 11/22/2014   Substance abuse (HCC) 04/11/2013   Epididymitis, left 02/21/2013   Renal insufficiency 08/25/2012   Bell's palsy 08/11/2012   HIV disease (HCC) 01/14/2012   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   CABOTEGRAVIR  & RILPIVIRINE  ER (CABENUVA ) 600 & 900 MG/3ML INJECTION    Inject 1 kit into the muscle every 2 (two) months.   CEPHALEXIN  (KEFLEX ) 500 MG CAPSULE    Take 1 capsule (500 mg total) by mouth 4 (four) times daily.   CREATINE PO    Take 1 Dose by mouth daily.   DOXYCYCLINE  (VIBRA -TABS) 100 MG TABLET    Take 2 tablets (200 mg) by mouth 24-72 hours after sex   OVER THE COUNTER MEDICATION    Take 1 tablet by mouth daily. Cardarine   OVER THE COUNTER MEDICATION    Take 1 tablet by mouth daily. Thermogenic Weight Loss Supplement. Clean Plus   PREDNISONE  (DELTASONE ) 10 MG TABLET    Take 2 tablets (20 mg total) by mouth 2 (two) times daily.   PROBIOTIC PRODUCT (PROBIOTIC  PO)    Take 1 Dose by mouth daily as needed.  Modified Medications   No medications on file  Discontinued Medications   No medications on file    Allergies: Allergies[1]  Labs: Lab Results  Component Value Date   HIV1RNAQUANT <20 DETECTED (A) 09/06/2024   HIV1RNAQUANT <20 DETECTED (A) 07/05/2024   HIV1RNAQUANT <20 (H) 02/09/2024   CD4TABS 569 09/14/2023   CD4TABS 602 03/01/2023   CD4TABS 649 07/06/2022    RPR and STI Lab Results  Component Value Date   LABRPR NON-REACTIVE 07/05/2024   LABRPR NON-REACTIVE 02/09/2024   LABRPR NON-REACTIVE 09/14/2023   LABRPR NON-REACTIVE 03/01/2023   LABRPR NON-REACTIVE 04/29/2021    STI Results GC CT  10/05/2024  1:20 PM Negative    Negative  Negative    Negative   10/05/2024  1:19 PM Negative  Negative   07/05/2024  9:53 AM Negative    Negative    Negative  Negative    Negative    Negative   09/14/2023  8:48 AM Negative    Negative  Negative    Negative   09/02/2022  4:36 PM Negative    Negative  Negative    Negative   07/21/2022 11:19 AM Negative  Negative   07/06/2022 11:14 AM Negative  Negative   04/29/2021  2:22 PM Negative  Negative   04/17/2020  2:35 PM Negative  Negative  09/22/2018 12:00 AM Negative    Negative    **POSITIVE**  **POSITIVE**    Negative    Negative   06/16/2017 12:00 AM Negative  Negative     Hepatitis B Lab Results  Component Value Date   HEPBSAB INDETER (A) 12/31/2011   HEPBSAG NEGATIVE 12/31/2011   HEPBCAB NEG 12/31/2011   Hepatitis C No results found for: HEPCAB, HCVRNAPCRQN Hepatitis A Lab Results  Component Value Date   HAV REACTIVE (A) 07/05/2024   Lipids: Lab Results  Component Value Date   CHOL 181 02/09/2024   TRIG 61 02/09/2024   HDL 38 (L) 02/09/2024   CHOLHDL 4.8 02/09/2024   VLDL 24 06/16/2017   LDLCALC 128 (H) 02/09/2024    Target Date: 17th  Assessment: Jose Carter presents today for his maintenance Cabenuva  injections. Past injections were tolerated well  without issues. Last HIV RNA was undetectable in September. Doing well with no issues today.  Lab work:  HIV RNA today? Seems to have been getting ~Q4 months but has been stable < 20***  Eligible vaccinations:  COVID-19, Shingrix; ***  Cabenuva : Administered cabotegravir  600mg /57mL in *** upper outer quadrant of the gluteal muscle. Administered rilpivirine  900 mg/3mL in the *** upper outer quadrant of the gluteal muscle. No issues with injections. *** will follow up in 2 months for next set of injections.  Plan: - Cabenuva  injections administered - Next injections scheduled for 03/06/25 - Call with any issues or questions  Cassie L. Kuppelweiser, PharmD, BCIDP, AAHIVP, CPP Clinical Pharmacist Practitioner - Infectious Diseases Clinical Pharmacist Lead - Specialty Pharmacy [facility] for Infectious Disease     [1]  Allergies Allergen Reactions   Sustiva [Efavirenz] Swelling    Pt states he was told by prison physician to list Sustiva as an allergy.    "

## 2025-01-02 ENCOUNTER — Ambulatory Visit: Admitting: Pharmacist

## 2025-01-08 ENCOUNTER — Telehealth: Payer: Self-pay

## 2025-01-08 ENCOUNTER — Other Ambulatory Visit (HOSPITAL_COMMUNITY): Payer: Self-pay

## 2025-01-08 NOTE — Telephone Encounter (Signed)
 Pharmacy Patient Advocate Encounter- Cabenuva  BIV-Medical Benefit:  J code: [id number]  CPT code: 03627  Dx Code: B20  NO PA is required through [employer] : Insurance may require clinical and lab notes when submitting claim. Authorization# [id number]

## 2025-01-09 NOTE — Progress Notes (Unsigned)
 "  HPI: Jose Carter is a 49 y.o. male who presents to the [facility] pharmacy clinic for Cabenuva  administration.  Referring ID Provider: Corean Fireman, NP  Patient Active Problem List   Diagnosis Date Noted   Ileus (HCC) 09/05/2024   Enterocolitis 08/27/2024   Hyperlipidemia 05/09/2024   History of colon polyps 02/09/2024   Need for prophylactic vaccination and inoculation against influenza 09/14/2023   Transaminitis 03/16/2023   Sore throat 09/02/2022   Anxiety 07/21/2022   Pain and swelling of right knee 02/25/2022   Seasonal allergic conjunctivitis 06/13/2021   Frequency of urination and polyuria 10/28/2020   Weight gain 12/31/2017   Tobacco abuse 12/31/2017   Fatigue 08/04/2017   Screening examination for venereal disease 11/22/2014   Encounter for long-term (current) use of medications 11/22/2014   Rash and nonspecific skin eruption 11/22/2014   Substance abuse (HCC) 04/11/2013   Epididymitis, left 02/21/2013   Renal insufficiency 08/25/2012   Bell's palsy 08/11/2012   HIV disease (HCC) 01/14/2012   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   CABOTEGRAVIR  & RILPIVIRINE  ER (CABENUVA ) 600 & 900 MG/3ML INJECTION    Inject 1 kit into the muscle every 2 (two) months.   CEPHALEXIN  (KEFLEX ) 500 MG CAPSULE    Take 1 capsule (500 mg total) by mouth 4 (four) times daily.   CREATINE PO    Take 1 Dose by mouth daily.   DOXYCYCLINE  (VIBRA -TABS) 100 MG TABLET    Take 2 tablets (200 mg) by mouth 24-72 hours after sex   OVER THE COUNTER MEDICATION    Take 1 tablet by mouth daily. Cardarine   OVER THE COUNTER MEDICATION    Take 1 tablet by mouth daily. Thermogenic Weight Loss Supplement. Clean Plus   PREDNISONE  (DELTASONE ) 10 MG TABLET    Take 2 tablets (20 mg total) by mouth 2 (two) times daily.   PROBIOTIC PRODUCT (PROBIOTIC  PO)    Take 1 Dose by mouth daily as needed.  Modified Medications   No medications on file  Discontinued Medications   No medications on file    Allergies: Allergies[1]  Labs: Lab Results  Component Value Date   HIV1RNAQUANT <20 DETECTED (A) 09/06/2024   HIV1RNAQUANT <20 DETECTED (A) 07/05/2024   HIV1RNAQUANT <20 (H) 02/09/2024   CD4TABS 569 09/14/2023   CD4TABS 602 03/01/2023   CD4TABS 649 07/06/2022    RPR and STI Lab Results  Component Value Date   LABRPR NON-REACTIVE 07/05/2024   LABRPR NON-REACTIVE 02/09/2024   LABRPR NON-REACTIVE 09/14/2023   LABRPR NON-REACTIVE 03/01/2023   LABRPR NON-REACTIVE 04/29/2021    STI Results GC CT  10/05/2024  1:20 PM Negative    Negative  Negative    Negative   10/05/2024  1:19 PM Negative  Negative   07/05/2024  9:53 AM Negative    Negative    Negative  Negative    Negative    Negative   09/14/2023  8:48 AM Negative    Negative  Negative    Negative   09/02/2022  4:36 PM Negative    Negative  Negative    Negative   07/21/2022 11:19 AM Negative  Negative   07/06/2022 11:14 AM Negative  Negative   04/29/2021  2:22 PM Negative  Negative   04/17/2020  2:35 PM Negative  Negative  09/22/2018 12:00 AM Negative    Negative    **POSITIVE**  **POSITIVE**    Negative    Negative   06/16/2017 12:00 AM Negative  Negative     Hepatitis B Lab Results  Component Value Date   HEPBSAB INDETER (A) 12/31/2011   HEPBSAG NEGATIVE 12/31/2011   HEPBCAB NEG 12/31/2011   Hepatitis C No results found for: HEPCAB, HCVRNAPCRQN Hepatitis A Lab Results  Component Value Date   HAV REACTIVE (A) 07/05/2024   Lipids: Lab Results  Component Value Date   CHOL 181 02/09/2024   TRIG 61 02/09/2024   HDL 38 (L) 02/09/2024   CHOLHDL 4.8 02/09/2024   VLDL 24 06/16/2017   LDLCALC 128 (H) 02/09/2024    Target Date: 17th  Assessment: Jose Carter presents today for his maintenance Cabenuva  injections. Past injections were tolerated well  without issues. Last HIV RNA was undetectable in September. Doing well with no issues today.  Lab work:  Skip HIV RNA today? Last check 4 mo ago; stable undetectable x years Nearly 83yr since last lipid panel; started atorva in September - although could wait for next visit/HIV check  Eligible vaccinations:  COVID-19, Shingles; ***  Cabenuva : Administered cabotegravir  600mg /15mL in *** upper outer quadrant of the gluteal muscle. Administered rilpivirine  900 mg/3mL in the *** upper outer quadrant of the gluteal muscle. No issues with injections. Jose Carter will follow up in 2 months for next set of injections.  Plan: - Cabenuva  injections administered - Next injections scheduled for 03/06/25 - Call with any issues or questions  Maurilio Patten, PharmD PGY1 Pharmacy Resident [facility] 01/09/2025 8:33 PM     [1]  Allergies Allergen Reactions   Sustiva [Efavirenz] Swelling    Pt states he was told by prison physician to list Sustiva as an allergy.    "

## 2025-01-10 ENCOUNTER — Other Ambulatory Visit: Payer: Self-pay

## 2025-01-10 ENCOUNTER — Ambulatory Visit: Admitting: Pharmacist

## 2025-01-10 DIAGNOSIS — B20 Human immunodeficiency virus [HIV] disease: Secondary | ICD-10-CM | POA: Diagnosis not present

## 2025-01-10 MED ORDER — CABOTEGRAVIR & RILPIVIRINE ER 600 & 900 MG/3ML IM SUER
1.0000 | Freq: Once | INTRAMUSCULAR | Status: AC
  Administered 2025-01-10: 1 via INTRAMUSCULAR

## 2025-01-10 NOTE — Progress Notes (Signed)
 SABRA

## 2025-01-11 ENCOUNTER — Telehealth: Payer: Self-pay

## 2025-01-11 ENCOUNTER — Other Ambulatory Visit

## 2025-01-11 ENCOUNTER — Other Ambulatory Visit: Payer: Self-pay

## 2025-01-11 ENCOUNTER — Other Ambulatory Visit (HOSPITAL_COMMUNITY)
Admission: RE | Admit: 2025-01-11 | Discharge: 2025-01-11 | Disposition: A | Source: Ambulatory Visit | Attending: Infectious Disease | Admitting: Infectious Disease

## 2025-01-11 DIAGNOSIS — Z113 Encounter for screening for infections with a predominantly sexual mode of transmission: Secondary | ICD-10-CM

## 2025-01-11 NOTE — Telephone Encounter (Signed)
 Patient called to ask if he could come in to do urine test for STD testing. States Sunday he had sexual encounter and did not take doxy pep after. Will come in today for urine test. Declines additional screening at this time. Lorenda CHRISTELLA Code, RMA

## 2025-01-12 ENCOUNTER — Ambulatory Visit: Payer: Self-pay | Admitting: Family

## 2025-01-12 LAB — URINE CYTOLOGY ANCILLARY ONLY
Chlamydia: NEGATIVE
Comment: NEGATIVE
Comment: NORMAL
Neisseria Gonorrhea: NEGATIVE

## 2025-03-05 ENCOUNTER — Encounter: Payer: Self-pay | Admitting: Infectious Disease

## 2025-03-06 ENCOUNTER — Ambulatory Visit: Admitting: Pharmacist

## 2025-05-03 ENCOUNTER — Ambulatory Visit: Admitting: Pharmacist
